# Patient Record
Sex: Male | Born: 1940 | Race: White | Hispanic: No | Marital: Married | Smoking: Never smoker
Health system: Southern US, Community
[De-identification: ages and names within clinical notes are randomized; demographics above are authoritative.]

## PROBLEM LIST (undated history)

## (undated) DIAGNOSIS — C61 Malignant neoplasm of prostate: Secondary | ICD-10-CM

## (undated) DIAGNOSIS — I499 Cardiac arrhythmia, unspecified: Secondary | ICD-10-CM

## (undated) DIAGNOSIS — I10 Essential (primary) hypertension: Secondary | ICD-10-CM

## (undated) DIAGNOSIS — I480 Paroxysmal atrial fibrillation: Secondary | ICD-10-CM

## (undated) HISTORY — PX: CHOLECYSTECTOMY: SHX55

## (undated) HISTORY — PX: BACK SURGERY: SHX140

## (undated) HISTORY — PX: HERNIA REPAIR: SHX51

## (undated) HISTORY — PX: PROSTATECTOMY: SHX69

## (undated) HISTORY — DX: Malignant neoplasm of prostate: C61

---

## 1999-03-20 ENCOUNTER — Emergency Department (HOSPITAL_COMMUNITY): Admission: EM | Admit: 1999-03-20 | Discharge: 1999-03-20 | Payer: Self-pay | Admitting: Emergency Medicine

## 1999-03-21 ENCOUNTER — Encounter: Payer: Self-pay | Admitting: Emergency Medicine

## 1999-04-04 ENCOUNTER — Ambulatory Visit (HOSPITAL_COMMUNITY): Admission: RE | Admit: 1999-04-04 | Discharge: 1999-04-04 | Payer: Self-pay | Admitting: *Deleted

## 1999-04-16 ENCOUNTER — Ambulatory Visit (HOSPITAL_COMMUNITY): Admission: RE | Admit: 1999-04-16 | Discharge: 1999-04-17 | Payer: Self-pay | Admitting: General Surgery

## 1999-04-24 ENCOUNTER — Encounter: Payer: Self-pay | Admitting: Emergency Medicine

## 1999-04-24 ENCOUNTER — Emergency Department (HOSPITAL_COMMUNITY): Admission: EM | Admit: 1999-04-24 | Discharge: 1999-04-24 | Payer: Self-pay | Admitting: Emergency Medicine

## 1999-04-30 ENCOUNTER — Ambulatory Visit (HOSPITAL_BASED_OUTPATIENT_CLINIC_OR_DEPARTMENT_OTHER): Admission: RE | Admit: 1999-04-30 | Discharge: 1999-04-30 | Payer: Self-pay | Admitting: General Surgery

## 2002-01-21 ENCOUNTER — Ambulatory Visit (HOSPITAL_COMMUNITY): Admission: RE | Admit: 2002-01-21 | Discharge: 2002-01-21 | Payer: Self-pay | Admitting: Gastroenterology

## 2005-10-05 ENCOUNTER — Ambulatory Visit (HOSPITAL_COMMUNITY): Admission: RE | Admit: 2005-10-05 | Discharge: 2005-10-05 | Payer: Self-pay | Admitting: Pediatrics

## 2005-10-13 ENCOUNTER — Ambulatory Visit: Payer: Self-pay | Admitting: Internal Medicine

## 2008-11-19 ENCOUNTER — Ambulatory Visit (HOSPITAL_COMMUNITY): Admission: RE | Admit: 2008-11-19 | Discharge: 2008-11-19 | Payer: Self-pay | Admitting: Urology

## 2009-04-28 ENCOUNTER — Ambulatory Visit: Admission: RE | Admit: 2009-04-28 | Discharge: 2009-07-03 | Payer: Self-pay | Admitting: Radiation Oncology

## 2009-05-04 LAB — PSA: PSA: 0.31 ng/mL (ref 0.10–4.00)

## 2009-07-05 ENCOUNTER — Ambulatory Visit: Admission: RE | Admit: 2009-07-05 | Discharge: 2009-07-17 | Payer: Self-pay | Admitting: Radiation Oncology

## 2010-03-17 ENCOUNTER — Ambulatory Visit: Payer: Self-pay | Admitting: Vascular Surgery

## 2010-03-17 ENCOUNTER — Ambulatory Visit: Admission: RE | Admit: 2010-03-17 | Discharge: 2010-03-17 | Payer: Self-pay | Admitting: Urology

## 2010-03-18 ENCOUNTER — Ambulatory Visit (HOSPITAL_COMMUNITY): Admission: RE | Admit: 2010-03-18 | Discharge: 2010-03-18 | Payer: Self-pay | Admitting: Urology

## 2010-05-23 ENCOUNTER — Emergency Department (HOSPITAL_COMMUNITY): Admission: EM | Admit: 2010-05-23 | Discharge: 2010-05-23 | Payer: Self-pay | Admitting: Emergency Medicine

## 2010-11-09 ENCOUNTER — Other Ambulatory Visit (HOSPITAL_COMMUNITY): Payer: Self-pay | Admitting: Urology

## 2010-11-09 ENCOUNTER — Other Ambulatory Visit: Payer: Self-pay

## 2010-11-09 DIAGNOSIS — C61 Malignant neoplasm of prostate: Secondary | ICD-10-CM

## 2010-11-17 ENCOUNTER — Encounter (HOSPITAL_COMMUNITY)
Admission: RE | Admit: 2010-11-17 | Discharge: 2010-11-17 | Disposition: A | Discharge status: Home / Self Care | Payer: Medicare PPO | Source: Ambulatory Visit | Attending: Urology | Admitting: Urology

## 2010-11-17 ENCOUNTER — Other Ambulatory Visit (HOSPITAL_COMMUNITY): Payer: Self-pay

## 2010-11-17 ENCOUNTER — Other Ambulatory Visit (HOSPITAL_COMMUNITY): Payer: Self-pay | Admitting: Urology

## 2010-11-17 ENCOUNTER — Encounter (HOSPITAL_COMMUNITY): Payer: Self-pay

## 2010-11-17 DIAGNOSIS — K7689 Other specified diseases of liver: Secondary | ICD-10-CM | POA: Insufficient documentation

## 2010-11-17 DIAGNOSIS — M899 Disorder of bone, unspecified: Secondary | ICD-10-CM | POA: Insufficient documentation

## 2010-11-17 DIAGNOSIS — C61 Malignant neoplasm of prostate: Secondary | ICD-10-CM

## 2010-11-17 DIAGNOSIS — Z9079 Acquired absence of other genital organ(s): Secondary | ICD-10-CM | POA: Insufficient documentation

## 2010-11-17 DIAGNOSIS — Z923 Personal history of irradiation: Secondary | ICD-10-CM | POA: Insufficient documentation

## 2010-11-17 HISTORY — DX: Essential (primary) hypertension: I10

## 2010-11-17 HISTORY — DX: Malignant neoplasm of prostate: C61

## 2010-11-17 LAB — POCT I-STAT, CHEM 8
Creatinine, Ser: 1.3 mg/dL (ref 0.4–1.5)
HCT: 52 % (ref 39.0–52.0)
Potassium: 3.6 mEq/L (ref 3.5–5.1)

## 2010-11-17 MED ORDER — IOHEXOL 300 MG/ML  SOLN
125.0000 mL | Freq: Once | INTRAMUSCULAR | Status: AC | PRN
  Administered 2010-11-17: 125 mL via INTRAVENOUS

## 2010-11-17 MED ORDER — TECHNETIUM TC 99M MEDRONATE IV KIT
21.0000 | PACK | Freq: Once | INTRAVENOUS | Status: AC | PRN
  Administered 2010-11-17: 21 via INTRAVENOUS

## 2010-11-17 MED ORDER — TECHNETIUM TC 99M MEDRONATE IV KIT: 25.0000 | PACK | Freq: Once | INTRAVENOUS | Status: DC | PRN

## 2010-11-19 NOTE — Procedures (Signed)
[facility]  Patient:    Gregory Bryan, Gregory Bryan Visit Number: [id number] MRN: [id number]          Service Type: END Location: ENDO Attending Physician:  Orland Mustard Dictated by:   Llana Aliment. Randa Evens, M.D. Proc. Date: 01/21/02 Admit Date:  01/21/2002 Discharge Date: 01/21/2002   CC:         Wilber Bihari, M.D.   Procedure Report  PROCEDURE:  Colonoscopy.  DATE OF BIRTH:  10-25-1940  MEDICATIONS:  Fentanyl 125 mcg, Versed 8 mg IV.  SCOPE:  Olympus pediatric video colonoscope.  INDICATIONS FOR PROCEDURE:  Strong family history of colon cancer.  DESCRIPTION OF PROCEDURE:  The procedure had been explained to the patient and consent obtained. With the patient in the left lateral decubitus position, the Olympus pediatric video colonoscope was inserted and advanced under direct visualization. The prep was excellent. We were able to reach the cecum using abdominal pressure and position changes. The ileocecal valve and appendiceal orifice were seen. The scope was withdrawn and the cecum, ascending colon, hepatic flexure, transverse colon, splenic flexure, descending and sigmoid colon were seen well upon removal. No polyps or other lesions were seen. Small internal hemorrhoids were seen in the rectum upon removal of the scope. The scope was withdrawn. The patient tolerated the procedure well.  ASSESSMENT:  Strong family history of colonic neoplasia with no polyps seen.  PLAN:  Will recommend repeating procedure in five years. Dictated by:   Llana Aliment. Randa Evens, M.D. Attending Physician:  Orland Mustard DD:  01/21/02 TD:  01/25/02 Job: [id number]

## 2010-12-09 ENCOUNTER — Other Ambulatory Visit: Payer: Self-pay | Admitting: Oncology

## 2010-12-09 ENCOUNTER — Encounter (HOSPITAL_BASED_OUTPATIENT_CLINIC_OR_DEPARTMENT_OTHER): Payer: Medicare PPO | Admitting: Oncology

## 2010-12-09 DIAGNOSIS — C7952 Secondary malignant neoplasm of bone marrow: Secondary | ICD-10-CM

## 2010-12-09 DIAGNOSIS — C61 Malignant neoplasm of prostate: Secondary | ICD-10-CM

## 2010-12-09 DIAGNOSIS — Z5111 Encounter for antineoplastic chemotherapy: Secondary | ICD-10-CM

## 2010-12-09 LAB — CBC WITH DIFFERENTIAL/PLATELET
BASO%: 0.4 % (ref 0.0–2.0)
Basophils Absolute: 0 10*3/uL (ref 0.0–0.1)
EOS%: 2.6 % (ref 0.0–7.0)
Eosinophils Absolute: 0.1 10*3/uL (ref 0.0–0.5)
LYMPH%: 25 % (ref 14.0–49.0)
MCH: 31 pg (ref 27.2–33.4)
MCHC: 34.6 g/dL (ref 32.0–36.0)
MCV: 89.6 fL (ref 79.3–98.0)
MONO#: 0.6 10*3/uL (ref 0.1–0.9)
NEUT#: 3.3 10*3/uL (ref 1.5–6.5)
NEUT%: 60.9 % (ref 39.0–75.0)
RDW: 13.7 % (ref 11.0–14.6)
lymph#: 1.4 10*3/uL (ref 0.9–3.3)

## 2010-12-09 LAB — COMPREHENSIVE METABOLIC PANEL
ALT: 20 U/L (ref 0–53)
AST: 19 U/L (ref 0–37)
Albumin: 4.2 g/dL (ref 3.5–5.2)
BUN: 18 mg/dL (ref 6–23)
Calcium: 9.1 mg/dL (ref 8.4–10.5)
Chloride: 102 mEq/L (ref 96–112)
Sodium: 143 mEq/L (ref 135–145)
Total Bilirubin: 0.5 mg/dL (ref 0.3–1.2)

## 2011-01-20 ENCOUNTER — Other Ambulatory Visit: Payer: Self-pay | Admitting: Oncology

## 2011-01-20 ENCOUNTER — Encounter (HOSPITAL_BASED_OUTPATIENT_CLINIC_OR_DEPARTMENT_OTHER): Payer: Medicare PPO | Admitting: Oncology

## 2011-01-20 DIAGNOSIS — E876 Hypokalemia: Secondary | ICD-10-CM

## 2011-01-20 DIAGNOSIS — C61 Malignant neoplasm of prostate: Secondary | ICD-10-CM

## 2011-01-20 DIAGNOSIS — C7952 Secondary malignant neoplasm of bone marrow: Secondary | ICD-10-CM

## 2011-01-20 LAB — CBC WITH DIFFERENTIAL/PLATELET
BASO%: 0.3 % (ref 0.0–2.0)
EOS%: 1.1 % (ref 0.0–7.0)
Eosinophils Absolute: 0.1 10*3/uL (ref 0.0–0.5)
HCT: 46.3 % (ref 38.4–49.9)
MCHC: 34 g/dL (ref 32.0–36.0)
NEUT#: 4.3 10*3/uL (ref 1.5–6.5)
RBC: 5.12 10*6/uL (ref 4.20–5.82)
WBC: 5.9 10*3/uL (ref 4.0–10.3)
lymph#: 1 10*3/uL (ref 0.9–3.3)

## 2011-01-20 LAB — COMPREHENSIVE METABOLIC PANEL
ALT: 45 U/L (ref 0–53)
Alkaline Phosphatase: 123 U/L — ABNORMAL HIGH (ref 39–117)
CO2: 30 mEq/L (ref 19–32)
Creatinine, Ser: 1.26 mg/dL (ref 0.50–1.35)
Potassium: 2.9 mEq/L — ABNORMAL LOW (ref 3.5–5.3)
Sodium: 140 mEq/L (ref 135–145)

## 2011-01-20 LAB — PSA: PSA: 0.32 ng/mL (ref <=4.00)

## 2011-03-08 ENCOUNTER — Encounter (HOSPITAL_BASED_OUTPATIENT_CLINIC_OR_DEPARTMENT_OTHER): Payer: Medicare PPO | Admitting: Oncology

## 2011-03-08 DIAGNOSIS — Z5111 Encounter for antineoplastic chemotherapy: Secondary | ICD-10-CM

## 2011-03-08 DIAGNOSIS — C61 Malignant neoplasm of prostate: Secondary | ICD-10-CM

## 2011-03-08 DIAGNOSIS — C7951 Secondary malignant neoplasm of bone: Secondary | ICD-10-CM

## 2011-05-19 ENCOUNTER — Encounter: Payer: Self-pay | Admitting: *Deleted

## 2011-06-06 ENCOUNTER — Telehealth: Payer: Self-pay | Admitting: Oncology

## 2011-06-07 ENCOUNTER — Ambulatory Visit: Payer: Medicare PPO | Admitting: Oncology

## 2011-06-22 ENCOUNTER — Encounter: Payer: Self-pay | Admitting: Oncology

## 2011-06-22 ENCOUNTER — Other Ambulatory Visit: Payer: Self-pay | Admitting: Oncology

## 2011-06-22 ENCOUNTER — Ambulatory Visit (HOSPITAL_BASED_OUTPATIENT_CLINIC_OR_DEPARTMENT_OTHER): Payer: Medicare PPO | Admitting: Oncology

## 2011-06-22 ENCOUNTER — Telehealth: Payer: Self-pay | Admitting: Oncology

## 2011-06-22 VITALS — BP 148/97 | HR 62 | Temp 97.4°F | Ht 71.8 in | Wt 244.6 lb

## 2011-06-22 DIAGNOSIS — Z5111 Encounter for antineoplastic chemotherapy: Secondary | ICD-10-CM

## 2011-06-22 DIAGNOSIS — C7952 Secondary malignant neoplasm of bone marrow: Secondary | ICD-10-CM

## 2011-06-22 DIAGNOSIS — C61 Malignant neoplasm of prostate: Secondary | ICD-10-CM

## 2011-06-22 HISTORY — DX: Malignant neoplasm of prostate: C61

## 2011-06-22 MED ORDER — LEUPROLIDE ACETATE (3 MONTH) 22.5 MG IM KIT
22.5000 mg | PACK | Freq: Once | INTRAMUSCULAR | Status: AC
  Administered 2011-06-22: 22.5 mg via INTRAMUSCULAR
  Filled 2011-06-22: qty 22.5

## 2011-06-22 MED ORDER — DENOSUMAB 120 MG/1.7ML ~~LOC~~ SOLN
120.0000 mg | Freq: Once | SUBCUTANEOUS | Status: AC
  Administered 2011-06-22: 120 mg via SUBCUTANEOUS
  Filled 2011-06-22: qty 1.7

## 2011-06-22 NOTE — Telephone Encounter (Signed)
gve the pt his jan,march 2013 appt calendars

## 2011-06-22 NOTE — Progress Notes (Signed)
Hematology and Oncology Follow Up Visit  Gregory Bryan [id number] Jun 04, 1941 70 y.o. 06/22/2011 4:12 PM  CC: Gregory Bryan, M.D.  Gregory Bryan, M.D.  Gregory Bryan, M.D.    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  Initial diagnosis of prostate cancer dating back to 2010, Gleason score 5 + 4 = 9, PSA 4.1.    Prior Therapy: 1. Status post prostatectomy, at that time was T3a N0 M0 disease, done December 24, 2008.  PSA nadir down to 0.25. 2. The patient received adjuvant radiation therapy to the prostatic bed due to a rise in his PSA and positive margins after his prostatectomy.  He received a total of 33 fractions for a total of 6600 cGy at that time.  Therapy between May 25, 2009, until July 15, 2009. 3. The patient had a rise in his PSA and subsequently his PSA on December 09, 2010, was as high as 10.0, and a bone scan showed bony metastasis.  Current therapy: 1. He is on Lupron 22.5 mg subcutaneous injection started on 12/09/2010.  He is status post 1 month of bicalutamide at 50 mg daily. 2. He is on vitamin D and calcium supplement.  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  He is a gentleman with hormone-sensitive prostate cancer with metastatic disease to the bone.  He had his first Lupron on 12/09/2010.  He had an excellent PSA response, with PSA dropping down to 0.32 on 01/20/2011.  His most recent PSA dropped further to 0.01.  He has tolerated Lupron without any major toxicity.  He does report some hot flashes again.  By his description, they are not quite as severe, associated with some perspiration that is easily manageable.  He had not reported any major changes in his activity level.  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  His energy has been reasonable.  He has not reported any back pain.  He did not report any shoulder pain.   Did not report any pathological fractures.  Performance status and activity level remain reasonable. He reports frequent urinations espieclly at night time. No bone pain noted at this time.  Medications: I have reviewed the patient's current medications. Current outpatient prescriptions:aspirin 325 MG tablet, Take 325 mg by mouth daily.  , Disp: , Rfl: ;  calcium carbonate (OS-CAL) 600 MG TABS, Take 600 mg by mouth daily.  , Disp: , Rfl: ;  diltiazem (TIAZAC) 300 MG 24 hr capsule, Take 300 mg by mouth daily.  , Disp: , Rfl: ;  hydrochlorothiazide (HYDRODIURIL) 25 MG tablet, Take 25 mg by mouth daily.  , Disp: , Rfl: ;  bicalutamide (CASODEX) 50 MG tablet, Take 50 mg by mouth daily.  , Disp: , Rfl:  Potassium Chloride (K-TABS PO), Take 20 mEq by mouth daily.  , Disp: , Rfl:  Current facility-administered medications:denosumab (XGEVA) injection 120 mg, 120 mg, Subcutaneous, Once, Eli Hose, MD, 120 mg at 06/22/11 1608;  leuprolide (LUPRON) injection 22.5 mg, 22.5 mg, Intramuscular, Once, Eli Hose, MD, 22.5 mg at 06/22/11 1611  Allergies:  Allergies  Allergen Reactions  . Oxycodone Nausea And Vomiting  . Percocet (Oxycodone-Acetaminophen)     Past Medical History, Surgical history, Social history, and Family History were reviewed and updated.  Review of Systems: Constitutional:  Negative for fever, chills, night sweats, anorexia, weight loss, pain. Cardiovascular: no chest pain or dyspnea  on exertion Respiratory: no cough, shortness of breath, or wheezing Neurological: no TIA or stroke symptoms negative Dermatological: negative ENT: negative Skin: Negative. Gastrointestinal: negative Genito-Urinary: positive for - urinary frequency/urgency Hematological and Lymphatic: negative Breast: negative Musculoskeletal: negative Remaining ROS negative. Physical Exam: Blood pressure 148/97, pulse 62, temperature 97.4 F (36.3 C), temperature source Oral, height 5' 11.8" (1.824 m), weight  244 lb 9.6 oz (110.95 kg). ECOG: 0 General appearance: alert Head: Normocephalic, without obvious abnormality, atraumatic Neck: no adenopathy, no carotid bruit, no JVD, supple, symmetrical, trachea midline and thyroid not enlarged, symmetric, no tenderness/mass/nodules Lymph nodes: Cervical, supraclavicular, and axillary nodes normal. Heart:regular rate and rhythm, S1, S2 normal, no murmur, click, rub or gallop Lung:chest clear, no wheezing, rales, normal symmetric air entry Abdomin: soft, non-tender, without masses or organomegaly EXT:no erythema, induration, or nodules   Lab Results:  CBC:  Normal CMET: Normal PSA: 0.01    Impression and Plan: A 70 year old gentleman with the following issues: 1. Hormone-sensitive prostate cancer.  He has metastatic disease to the bone.  His PSA dropped down from 10 down to 0.01.  He has continued to be asymptomatic at this point.  He has continued to be hormone sensitive.  I will proceed with Lupron today and will repeat it every 3 months.  I continued to reiterate some of the toxicities associated with Lupron and testosterone deprivation that include osteoporosis, hot flashes, erectile dysfunction as well as possible cognitive difficulties. 2. Bony disease.  We will proceed with Xgeva today. Risks and complications discussed today. He is agreeable. Will repeat in 6 weeks. 3. Hypokalemia.  His potassium is now back to normal at 3.8 and no longer needs supplements at this time.     [facility], MD 12/19/20124:12 PM

## 2011-08-03 ENCOUNTER — Ambulatory Visit (HOSPITAL_BASED_OUTPATIENT_CLINIC_OR_DEPARTMENT_OTHER): Payer: Medicare Other

## 2011-08-03 ENCOUNTER — Other Ambulatory Visit: Payer: Self-pay | Admitting: Oncology

## 2011-08-03 VITALS — BP 120/80 | HR 77 | Temp 99.1°F

## 2011-08-03 DIAGNOSIS — C7951 Secondary malignant neoplasm of bone: Secondary | ICD-10-CM

## 2011-08-03 DIAGNOSIS — C61 Malignant neoplasm of prostate: Secondary | ICD-10-CM

## 2011-08-03 MED ORDER — DENOSUMAB 120 MG/1.7ML ~~LOC~~ SOLN
120.0000 mg | Freq: Once | SUBCUTANEOUS | Status: AC
  Administered 2011-08-03: 120 mg via SUBCUTANEOUS
  Filled 2011-08-03: qty 1.7

## 2011-08-04 ENCOUNTER — Telehealth: Payer: Self-pay | Admitting: Oncology

## 2011-08-04 NOTE — Telephone Encounter (Signed)
Called pt re appt for 2/27 @ 1 pm (d/t per FS). Per pt he couldn't take info down right now. Per pt he will call our office back to get appt info.

## 2011-08-31 ENCOUNTER — Ambulatory Visit: Payer: Medicare Other | Admitting: Oncology

## 2011-09-16 ENCOUNTER — Ambulatory Visit (HOSPITAL_BASED_OUTPATIENT_CLINIC_OR_DEPARTMENT_OTHER): Payer: Medicare Other | Admitting: Oncology

## 2011-09-16 ENCOUNTER — Telehealth: Payer: Self-pay | Admitting: Oncology

## 2011-09-16 VITALS — BP 115/67 | HR 79 | Temp 97.7°F | Ht 71.8 in | Wt 229.0 lb

## 2011-09-16 DIAGNOSIS — C7951 Secondary malignant neoplasm of bone: Secondary | ICD-10-CM

## 2011-09-16 DIAGNOSIS — C61 Malignant neoplasm of prostate: Secondary | ICD-10-CM

## 2011-09-16 MED ORDER — LEUPROLIDE ACETATE (3 MONTH) 22.5 MG IM KIT
22.5000 mg | PACK | Freq: Once | INTRAMUSCULAR | Status: AC
  Administered 2011-09-16: 22.5 mg via INTRAMUSCULAR
  Filled 2011-09-16: qty 22.5

## 2011-09-16 MED ORDER — DENOSUMAB 120 MG/1.7ML ~~LOC~~ SOLN
120.0000 mg | Freq: Once | SUBCUTANEOUS | Status: AC
  Administered 2011-09-16: 120 mg via SUBCUTANEOUS
  Filled 2011-09-16: qty 1.7

## 2011-09-16 NOTE — Telephone Encounter (Signed)
appts made and printed for pt aom °

## 2011-09-16 NOTE — Progress Notes (Signed)
Hematology and Oncology Follow Up Visit  Gregory Bryan [id number] 01-26-1941 71 y.o. 09/16/2011 1:51 PM  CC: [name], M.D.  [name], M.D.  [name], M.D.    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  Initial diagnosis of prostate cancer dating back to 2010, Gleason score 5 + 4 = 9, PSA 4.1.    Prior Therapy: 1. Status post prostatectomy, at that time was T3a N0 M0 disease, done December 24, 2008.  PSA nadir down to 0.25. 2. The patient received adjuvant radiation therapy to the prostatic bed due to a rise in his PSA and positive margins after his prostatectomy.  He received a total of 33 fractions for a total of 6600 cGy at that time.  Therapy between May 25, 2009, until July 15, 2009. 3. The patient had a rise in his PSA and subsequently his PSA on December 09, 2010, was as high as 10.0, and a bone scan showed bony metastasis.  Current therapy: 1. He is on Lupron 22.5 mg subcutaneous injection started on 12/09/2010.  He is status post 1 month of bicalutamide at 50 mg daily. 2. He is on vitamin D and calcium supplement.  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  He is a gentleman with hormone-sensitive prostate cancer with metastatic disease to the bone.  He had his first Lupron on 12/09/2010.  He had an excellent PSA response, with PSA dropping down to 0.32 on 01/20/2011 and now it is 0.01.  He has tolerated Lupron without any major toxicity.  He does report some hot flashes again.  By his description, they are not quite as severe, associated with some perspiration that is easily manageable.  He had not reported any major changes in his activity level.  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  His energy has been reasonable.  He has not reported any back pain.  He did not report any shoulder pain.  Did not report any  pathological fractures.  Performance status and activity level remain reasonable. He reports frequent urinations espieclly at night time. No bone pain noted at this time. Energy has been the same at this time.  Medications: I have reviewed the patient's current medications. Current outpatient prescriptions:aspirin 325 MG tablet, Take 325 mg by mouth daily.  , Disp: , Rfl: ;  bicalutamide (CASODEX) 50 MG tablet, Take 50 mg by mouth daily.  , Disp: , Rfl: ;  calcium carbonate (OS-CAL) 600 MG TABS, Take 600 mg by mouth daily.  , Disp: , Rfl: ;  hydrochlorothiazide (HYDRODIURIL) 25 MG tablet, Take 25 mg by mouth daily.  , Disp: , Rfl: ;  Potassium Chloride (K-TABS PO), Take 20 mEq by mouth daily.  , Disp: , Rfl:  verapamil (CALAN) 120 MG tablet, Take 120 mg by mouth daily., Disp: , Rfl:  Current facility-administered medications:denosumab (XGEVA) injection 120 mg, 120 mg, Subcutaneous, Once, Benjiman Core, MD;  leuprolide (LUPRON) injection 22.5 mg, 22.5 mg, Intramuscular, Once, Benjiman Core, MD  Allergies:  Allergies  Allergen Reactions  . Oxycodone Nausea And Vomiting  . Percocet (Oxycodone-Acetaminophen)     Past Medical History, Surgical history, Social history, and Family History were reviewed and updated.  Review of Systems: Constitutional:  Negative for fever, chills, night sweats, anorexia, weight loss, pain. Cardiovascular: no chest pain or dyspnea on exertion Respiratory: no cough, shortness of breath,  or wheezing Neurological: no TIA or stroke symptoms negative Dermatological: negative ENT: negative Skin: Negative. Gastrointestinal: negative Genito-Urinary: positive for - urinary frequency/urgency Hematological and Lymphatic: negative Breast: negative Musculoskeletal: negative Remaining ROS negative. Physical Exam: Blood pressure 115/67, pulse 79, temperature 97.7 F (36.5 C), temperature source Oral, height 5' 11.8" (1.824 m), weight 229 lb (103.874 kg). ECOG: 0 General  appearance: alert Head: Normocephalic, without obvious abnormality, atraumatic Neck: no adenopathy, no carotid bruit, no JVD, supple, symmetrical, trachea midline and thyroid not enlarged, symmetric, no tenderness/mass/nodules Lymph nodes: Cervical, supraclavicular, and axillary nodes normal. Heart:regular rate and rhythm, S1, S2 normal, no murmur, click, rub or gallop Lung:chest clear, no wheezing, rales, normal symmetric air entry Abdomin: soft, non-tender, without masses or organomegaly EXT:no erythema, induration, or nodules   Lab Results reviewed.: 1/31  CBC:  Normal CMET: Normal PSA: 0.01    Impression and Plan: A 71 year old gentleman with the following issues: 1. Hormone-sensitive prostate cancer.  He has metastatic disease to the bone.  His PSA dropped down from 10 down to 0.01.  He has continued to be asymptomatic at this point.  He has continued to be hormone sensitive.  I will proceed with Lupron today and will repeat it every 3 months.  I continued to reiterate some of the toxicities associated with Lupron and testosterone deprivation that include osteoporosis, hot flashes, erectile dysfunction as well as possible cognitive difficulties. 2. Bony disease.  We will proceed with Xgeva today. Risks and complications discussed today. He is agreeable. Will repeat in 6 weeks. 3. Hypokalemia.  His potassium is now back to normal at 3.8 and no longer needs supplements at this time.     Eli Hose, MD 3/15/20131:51 PM

## 2011-11-04 ENCOUNTER — Ambulatory Visit: Payer: Medicare Other

## 2011-11-25 ENCOUNTER — Ambulatory Visit: Payer: Medicare Other

## 2011-11-30 ENCOUNTER — Telehealth: Payer: Self-pay | Admitting: *Deleted

## 2011-11-30 NOTE — Telephone Encounter (Signed)
patient called on 11-30-2011 to set up appointment for his missed xgeva injection patient confirmed over the phone on 11-30-2011

## 2011-12-01 ENCOUNTER — Ambulatory Visit (HOSPITAL_BASED_OUTPATIENT_CLINIC_OR_DEPARTMENT_OTHER): Payer: Medicare Other

## 2011-12-01 VITALS — BP 131/84 | HR 73 | Temp 97.4°F

## 2011-12-01 DIAGNOSIS — C7951 Secondary malignant neoplasm of bone: Secondary | ICD-10-CM

## 2011-12-01 DIAGNOSIS — C61 Malignant neoplasm of prostate: Secondary | ICD-10-CM

## 2011-12-01 MED ORDER — DENOSUMAB 120 MG/1.7ML ~~LOC~~ SOLN
120.0000 mg | Freq: Once | SUBCUTANEOUS | Status: AC
  Administered 2011-12-01: 120 mg via SUBCUTANEOUS
  Filled 2011-12-01: qty 1.7

## 2011-12-23 ENCOUNTER — Ambulatory Visit (HOSPITAL_BASED_OUTPATIENT_CLINIC_OR_DEPARTMENT_OTHER): Payer: Medicare Other | Admitting: Oncology

## 2011-12-23 ENCOUNTER — Telehealth: Payer: Self-pay | Admitting: Oncology

## 2011-12-23 VITALS — BP 124/82 | HR 84 | Temp 97.0°F | Wt 235.1 lb

## 2011-12-23 DIAGNOSIS — C7951 Secondary malignant neoplasm of bone: Secondary | ICD-10-CM

## 2011-12-23 DIAGNOSIS — C61 Malignant neoplasm of prostate: Secondary | ICD-10-CM

## 2011-12-23 MED ORDER — DENOSUMAB 120 MG/1.7ML ~~LOC~~ SOLN: 120.0000 mg | Freq: Once | SUBCUTANEOUS | Status: DC

## 2011-12-23 MED ORDER — LEUPROLIDE ACETATE (3 MONTH) 22.5 MG IM KIT
22.5000 mg | PACK | Freq: Once | INTRAMUSCULAR | Status: AC
  Administered 2011-12-23: 22.5 mg via INTRAMUSCULAR
  Filled 2011-12-23: qty 22.5

## 2011-12-23 NOTE — Telephone Encounter (Signed)
Gave pt appt for injection in August  And MD in September 2013

## 2011-12-23 NOTE — Progress Notes (Signed)
Hematology and Oncology Follow Up Visit  Gregory Bryan [id number] 14-Mar-1941 71 y.o. 12/23/2011 1:50 PM  CC: [name], M.D.  [name], M.D.  [name], M.D.    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  Initial diagnosis of prostate cancer dating back to 2010, Gleason score 5 + 4 = 9, PSA 4.1.    Prior Therapy: 1. Status post prostatectomy, at that time was T3a N0 M0 disease, done December 24, 2008.  PSA nadir down to 0.25. 2. The patient received adjuvant radiation therapy to the prostatic bed due to a rise in his PSA and positive margins after his prostatectomy.  He received a total of 33 fractions for a total of 6600 cGy at that time.  Therapy between May 25, 2009, until July 15, 2009. 3. The patient had a rise in his PSA and subsequently his PSA on December 09, 2010, was as high as 10.0, and a bone scan showed bony metastasis.  Current therapy: 1. He is on Lupron 22.5 mg subcutaneous injection started on 12/09/2010.  He is status post 1 month of bicalutamide at 50 mg daily. 2. He is on vitamin D and calcium supplement. 3. Xgeva every 4-6 months.   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  He is a gentleman with hormone-sensitive prostate cancer with metastatic disease to the bone.  He had his first Lupron on 12/09/2010.  He had an excellent PSA response, with PSA dropping down to 0.32 on 01/20/2011 and now it is 0.01.  He has tolerated Lupron without any major toxicity.  He does report some hot flashes again which occur daily and are manageable.  He had not reported any major changes in his activity level.  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  His energy has been reasonable.  He has not reported any back pain.  He did not report any shoulder pain.  Did not report any pathological fractures.  Performance status and  activity level remain reasonable. He reports frequent urinations espieclly at night time. No bone pain noted at this time.   Medications: I have reviewed the patient's current medications. Current outpatient prescriptions:aspirin 325 MG tablet, Take 325 mg by mouth daily.  , Disp: , Rfl: ;  bicalutamide (CASODEX) 50 MG tablet, Take 50 mg by mouth daily.  , Disp: , Rfl: ;  calcium carbonate (OS-CAL) 600 MG TABS, Take 600 mg by mouth daily.  , Disp: , Rfl: ;  hydrochlorothiazide (HYDRODIURIL) 25 MG tablet, Take 25 mg by mouth daily.  , Disp: , Rfl: ;  Potassium Chloride (K-TABS PO), Take 20 mEq by mouth daily.  , Disp: , Rfl:  verapamil (CALAN) 120 MG tablet, Take 120 mg by mouth daily., Disp: , Rfl: ;  DISCONTD: diltiazem (TIAZAC) 300 MG 24 hr capsule, Take 300 mg by mouth daily.  , Disp: , Rfl:  Current facility-administered medications:leuprolide (LUPRON) injection 22.5 mg, 22.5 mg, Intramuscular, Once, Benjiman Core, MD;  DISCONTD: denosumab (XGEVA) injection 120 mg, 120 mg, Subcutaneous, Once, Benjiman Core, MD  Allergies:  Allergies  Allergen Reactions  . Oxycodone Nausea And Vomiting  . Percocet (Oxycodone-Acetaminophen)     Past Medical History, Surgical history, Social history, and Family History were reviewed and updated.  Review of Systems: Constitutional:  Negative for fever, chills, night sweats, anorexia, weight loss, pain. Cardiovascular: no chest pain or  dyspnea on exertion Respiratory: no cough, shortness of breath, or wheezing Neurological: no TIA or stroke symptoms negative Dermatological: negative ENT: negative Skin: Negative. Gastrointestinal: negative Genito-Urinary: positive for - urinary frequency/urgency Hematological and Lymphatic: negative Breast: negative Musculoskeletal: negative Remaining ROS negative. Physical Exam: Blood pressure 124/82, pulse 84, temperature 97 F (36.1 C), temperature source Oral, weight 235 lb 1 oz (106.624 kg). ECOG: 0 General  appearance: alert Head: Normocephalic, without obvious abnormality, atraumatic Neck: no adenopathy, no carotid bruit, no JVD, supple, symmetrical, trachea midline and thyroid not enlarged, symmetric, no tenderness/mass/nodules Lymph nodes: Cervical, supraclavicular, and axillary nodes normal. Heart:regular rate and rhythm, S1, S2 normal, no murmur, click, rub or gallop Lung:chest clear, no wheezing, rales, normal symmetric air entry Abdomin: soft, non-tender, without masses or organomegaly EXT:no erythema, induration, or nodules   Lab Results reviewed 09/2011  CBC:  Normal CMET: Normal PSA: 0.01  Labs from 6/20 Pending: By patient report PSA is < 0.01   Impression and Plan: A 71 year old gentleman with the following issues: 1. Hormone-sensitive prostate cancer.  He has metastatic disease to the bone.  His PSA dropped down from 10 down to 0.01.  He has continued to be asymptomatic at this point.  He has continued to be hormone sensitive.  I will proceed with Lupron today and will repeat it every 3 months.  I continued to reiterate some of the toxicities associated with Lupron and testosterone deprivation that include osteoporosis, hot flashes, erectile dysfunction as well as possible cognitive difficulties. 2. Bony disease.  We will proceed with Xgeva today. Risks and complications discussed today. He is agreeable. Will repeat in 6 weeks. 3. Hypokalemia.  His potassium is now back to normal at 3.8 and no longer needs supplements at this time. Lytes from 6/20 are pending today.      Eli Hose, MD 6/21/20131:50 PM

## 2012-02-03 ENCOUNTER — Ambulatory Visit (HOSPITAL_BASED_OUTPATIENT_CLINIC_OR_DEPARTMENT_OTHER): Payer: Medicare Other

## 2012-02-03 VITALS — BP 138/95 | HR 69 | Temp 97.3°F

## 2012-02-03 DIAGNOSIS — C61 Malignant neoplasm of prostate: Secondary | ICD-10-CM

## 2012-02-03 DIAGNOSIS — C7952 Secondary malignant neoplasm of bone marrow: Secondary | ICD-10-CM

## 2012-02-03 MED ORDER — DENOSUMAB 120 MG/1.7ML ~~LOC~~ SOLN
120.0000 mg | Freq: Once | SUBCUTANEOUS | Status: AC
  Administered 2012-02-03: 120 mg via SUBCUTANEOUS
  Filled 2012-02-03: qty 1.7

## 2012-02-03 NOTE — Patient Instructions (Signed)
Call MD for pproblems 

## 2012-03-23 ENCOUNTER — Telehealth: Payer: Self-pay | Admitting: Oncology

## 2012-03-23 ENCOUNTER — Ambulatory Visit (HOSPITAL_BASED_OUTPATIENT_CLINIC_OR_DEPARTMENT_OTHER): Payer: Medicare Other | Admitting: Oncology

## 2012-03-23 VITALS — BP 132/82 | HR 77 | Temp 97.3°F | Resp 20 | Ht 71.8 in | Wt 236.2 lb

## 2012-03-23 DIAGNOSIS — C61 Malignant neoplasm of prostate: Secondary | ICD-10-CM

## 2012-03-23 DIAGNOSIS — C7951 Secondary malignant neoplasm of bone: Secondary | ICD-10-CM

## 2012-03-23 DIAGNOSIS — Z5111 Encounter for antineoplastic chemotherapy: Secondary | ICD-10-CM

## 2012-03-23 MED ORDER — LEUPROLIDE ACETATE (3 MONTH) 22.5 MG IM KIT
22.5000 mg | PACK | Freq: Once | INTRAMUSCULAR | Status: AC
  Administered 2012-03-23: 22.5 mg via INTRAMUSCULAR
  Filled 2012-03-23: qty 22.5

## 2012-03-23 MED ORDER — DENOSUMAB 120 MG/1.7ML ~~LOC~~ SOLN
120.0000 mg | Freq: Once | SUBCUTANEOUS | Status: AC
  Administered 2012-03-23: 120 mg via SUBCUTANEOUS
  Filled 2012-03-23: qty 1.7

## 2012-03-23 NOTE — Progress Notes (Signed)
Hematology and Oncology Follow Up Visit  JAISON SCHELLE [id number] 1940-10-14 71 y.o. 03/23/2012 1:39 PM  CC: [name], M.D.  [name], M.D.  [name], M.D.    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  Initial diagnosis of prostate cancer dating back to 2010, Gleason score 5 + 4 = 9, PSA 4.1.    Prior Therapy: 1. Status post prostatectomy, at that time was T3a N0 M0 disease, done December 24, 2008.  PSA nadir down to 0.25. 2. The patient received adjuvant radiation therapy to the prostatic bed due to a rise in his PSA and positive margins after his prostatectomy.  He received a total of 33 fractions for a total of 6600 cGy at that time.  Therapy between May 25, 2009, until July 15, 2009. 3. The patient had a rise in his PSA and subsequently his PSA on December 09, 2010, was as high as 10.0, and a bone scan showed bony metastasis.  Current therapy: 1. He is on Lupron 22.5 mg subcutaneous injection started on 12/09/2010.  He is status post 1 month of bicalutamide at 50 mg daily. 2. He is on vitamin D and calcium supplement. 3. 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  He is a gentleman with hormone-sensitive prostate cancer with metastatic disease to the bone.  He had his first Lupron on 12/09/2010.  He had an excellent PSA response, with PSA dropping down to 0.32 on 01/20/2011 and now it is 0.01.  He has tolerated Lupron without any major toxicity.  He does report some hot flashes again which occur daily and are manageable.  He had not reported any major changes in his activity level.  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  His energy has been reasonable.  He has not reported any back pain.  He did not report any shoulder pain.  Did not report any pathological fractures.  Performance status and activity  level remain reasonable. He reports frequent urinations espieclly at night time. No bone pain noted at this time. Hot flashes remains noticeable but manageable at this time.   Medications: I have reviewed the patient's current medications. Current outpatient prescriptions:aspirin 325 MG tablet, Take 325 mg by mouth daily.  , Disp: , Rfl: ;  calcium carbonate (OS-CAL) 600 MG TABS, Take 600 mg by mouth daily.  , Disp: , Rfl: ;  hydrochlorothiazide (HYDRODIURIL) 25 MG tablet, Take 25 mg by mouth daily.  , Disp: , Rfl: ;  verapamil (CALAN) 120 MG tablet, Take 120 mg by mouth daily., Disp: , Rfl:  DISCONTD: diltiazem (TIAZAC) 300 MG 24 hr capsule, Take 300 mg by mouth daily.  , Disp: , Rfl:  Current facility-administered medications:denosumab (XGEVA) injection 120 mg, 120 mg, Subcutaneous, Once, Benjiman Core, MD;  leuprolide (LUPRON) injection 22.5 mg, 22.5 mg, Intramuscular, Once, Benjiman Core, MD  Allergies:  Allergies  Allergen Reactions  . Oxycodone Nausea And Vomiting  . Percocet (Oxycodone-Acetaminophen)     Past Medical History, Surgical history, Social history, and Family History were reviewed and updated.  Review of Systems: Constitutional:  Negative for fever, chills, night sweats, anorexia, weight loss, pain. Cardiovascular: no chest pain or dyspnea on exertion Respiratory: no cough, shortness of breath, or wheezing Neurological: no TIA or stroke symptoms negative Dermatological: negative ENT: negative Skin: Negative. Gastrointestinal: negative Genito-Urinary: positive  for - urinary frequency/urgency Hematological and Lymphatic: negative Breast: negative Musculoskeletal: negative Remaining ROS negative. Physical Exam: Blood pressure 132/82, pulse 77, temperature 97.3 F (36.3 C), temperature source Oral, resp. rate 20, height 5' 11.8" (1.824 m), weight 236 lb 3.2 oz (107.14 kg). ECOG: 0 General appearance: alert Head: Normocephalic, without obvious abnormality,  atraumatic Neck: no adenopathy, no carotid bruit, no JVD, supple, symmetrical, trachea midline and thyroid not enlarged, symmetric, no tenderness/mass/nodules Lymph nodes: Cervical, supraclavicular, and axillary nodes normal. Heart:regular rate and rhythm, S1, S2 normal, no murmur, click, rub or gallop Lung:chest clear, no wheezing, rales, normal symmetric air entry Abdomin: soft, non-tender, without masses or organomegaly EXT:no erythema, induration, or nodules   Lab Results reviewed from 03/21/2012 (a copy to be scanned)  CBC:  Normal CMET: Normal Ca: 9.7 PSA: 0.01     Impression and Plan: A 71 year old gentleman with the following issues: 1. Hormone-sensitive prostate cancer.  He has metastatic disease to the bone.  His PSA dropped down from 10 down to 0.01.  He has continued to be asymptomatic at this point.  He has continued to be hormone sensitive.  I will proceed with Lupron today and will repeat it every 3 months.  I continued to reiterate some of the toxicities associated with Lupron and testosterone deprivation that include osteoporosis, hot flashes, erectile dysfunction as well as possible cognitive difficulties. 2. Bony disease.  We will proceed with Xgeva today. Risks and complications discussed today. He is agreeable. Will repeat in 6 weeks. 3. Hypokalemia.  His potassium is now back to normal at 4.1 and no longer needs supplements at this time. Lytes from 6/20 are pending today.      Eli Hose, MD 9/20/20131:39 PM

## 2012-03-23 NOTE — Telephone Encounter (Signed)
gve the pt his oct and dec 2013 appt calendar

## 2012-04-27 ENCOUNTER — Ambulatory Visit (HOSPITAL_BASED_OUTPATIENT_CLINIC_OR_DEPARTMENT_OTHER): Payer: Medicare Other

## 2012-04-27 VITALS — BP 141/95 | HR 75 | Temp 96.9°F

## 2012-04-27 DIAGNOSIS — C61 Malignant neoplasm of prostate: Secondary | ICD-10-CM

## 2012-04-27 DIAGNOSIS — C7951 Secondary malignant neoplasm of bone: Secondary | ICD-10-CM

## 2012-04-27 MED ORDER — DENOSUMAB 120 MG/1.7ML ~~LOC~~ SOLN
120.0000 mg | Freq: Once | SUBCUTANEOUS | Status: AC
  Administered 2012-04-27: 120 mg via SUBCUTANEOUS
  Filled 2012-04-27: qty 1.7

## 2012-06-22 ENCOUNTER — Ambulatory Visit (HOSPITAL_BASED_OUTPATIENT_CLINIC_OR_DEPARTMENT_OTHER): Payer: Medicare Other | Admitting: Oncology

## 2012-06-22 ENCOUNTER — Telehealth: Payer: Self-pay | Admitting: Oncology

## 2012-06-22 VITALS — BP 135/90 | HR 102 | Temp 97.2°F | Resp 18 | Ht 71.8 in | Wt 243.3 lb

## 2012-06-22 DIAGNOSIS — C7952 Secondary malignant neoplasm of bone marrow: Secondary | ICD-10-CM

## 2012-06-22 DIAGNOSIS — C61 Malignant neoplasm of prostate: Secondary | ICD-10-CM

## 2012-06-22 DIAGNOSIS — Z5111 Encounter for antineoplastic chemotherapy: Secondary | ICD-10-CM

## 2012-06-22 MED ORDER — DENOSUMAB 120 MG/1.7ML ~~LOC~~ SOLN: 120.0000 mg | Freq: Once | SUBCUTANEOUS | Status: DC

## 2012-06-22 MED ORDER — LEUPROLIDE ACETATE (3 MONTH) 22.5 MG IM KIT
22.5000 mg | PACK | Freq: Once | INTRAMUSCULAR | Status: AC
  Administered 2012-06-22: 22.5 mg via INTRAMUSCULAR
  Filled 2012-06-22: qty 22.5

## 2012-06-22 NOTE — Progress Notes (Signed)
Hematology and Oncology Follow Up Visit  Gregory Bryan [id number] 1941-03-06 72 y.o. 06/22/2012 2:37 PM  CC: Gregory Bryan, M.D.  [name], M.D.  [name], M.D.    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  Initial diagnosis of prostate cancer dating back to 2010, Gleason score 5 + 4 = 9, PSA 4.1.    Prior Therapy: 1. Status post prostatectomy, at that time was T3a N0 M0 disease, done December 24, 2008.  PSA nadir down to 0.25. 2. The patient received adjuvant radiation therapy to the prostatic bed due to a rise in his PSA and positive margins after his prostatectomy.  He received a total of 33 fractions for a total of 6600 cGy at that time.  Therapy between May 25, 2009, until July 15, 2009. 3. The patient had a rise in his PSA and subsequently his PSA on December 09, 2010, was as high as 10.0, and a bone scan showed bony metastasis.  Current therapy: 1. He is on Lupron 22.5 mg subcutaneous injection started on 12/09/2010.  He is status post 1 month of bicalutamide at 50 mg daily. 2. He is on vitamin D and calcium supplement. 3. 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  He is a gentleman with hormone-sensitive prostate cancer with metastatic disease to the bone.  He had his first Lupron on 12/09/2010.  He had an excellent PSA response, with PSA dropping down to 0.32 on 01/20/2011 and now it is less than  0.01.  He has tolerated Lupron without any major toxicity.  He does report some hot flashes again which occur daily and are manageable.  He had not reported any major changes in his activity level.  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  His energy has been reasonable.  He has not reported any back pain.  He did not report any shoulder pain.  Did not report any pathological fractures.  Performance status  and activity level remain reasonable. He reports frequent urinations espieclly at night time. No bone pain noted at this time. He is planing to have dental work done in th near future.    Medications: I have reviewed the patient's current medications. Current outpatient prescriptions:aspirin 325 MG tablet, Take 325 mg by mouth daily.  , Disp: , Rfl: ;  calcium carbonate (OS-CAL) 600 MG TABS, Take 600 mg by mouth daily.  , Disp: , Rfl: ;  hydrochlorothiazide (HYDRODIURIL) 25 MG tablet, Take 25 mg by mouth daily.  , Disp: , Rfl: ;  verapamil (CALAN) 120 MG tablet, Take 120 mg by mouth daily., Disp: , Rfl:  [DISCONTINUED] diltiazem (TIAZAC) 300 MG 24 hr capsule, Take 300 mg by mouth daily.  , Disp: , Rfl:  Current facility-administered medications:leuprolide (LUPRON) injection 22.5 mg, 22.5 mg, Intramuscular, Once, Myrtis Ser, NP  Allergies:  Allergies  Allergen Reactions  . Oxycodone Nausea And Vomiting  . Percocet (Oxycodone-Acetaminophen)     Past Medical History, Surgical history, Social history, and Family History were reviewed and updated.  Review of Systems: Constitutional:  Negative for fever, chills, night sweats, anorexia, weight loss, pain. Cardiovascular: no chest pain or dyspnea on exertion Respiratory: no cough, shortness of breath, or wheezing Neurological: no TIA or stroke symptoms negative Dermatological: negative ENT: negative Skin: Negative. Gastrointestinal: negative Genito-Urinary: positive for - urinary frequency/urgency Hematological and Lymphatic:  negative Breast: negative Musculoskeletal: negative Remaining ROS negative. Physical Exam: Blood pressure 135/90, pulse 102, temperature 97.2 F (36.2 C), temperature source Oral, resp. rate 18, height 5' 11.8" (1.824 m), weight 243 lb 4.8 oz (110.36 kg). ECOG: 0 General appearance: alert Head: Normocephalic, without obvious abnormality, atraumatic Neck: no adenopathy, no carotid bruit, no JVD, supple,  symmetrical, trachea midline and thyroid not enlarged, symmetric, no tenderness/mass/nodules Lymph nodes: Cervical, supraclavicular, and axillary nodes normal. Heart:regular rate and rhythm, S1, S2 normal, no murmur, click, rub or gallop Lung:chest clear, no wheezing, rales, normal symmetric air entry Abdomin: soft, non-tender, without masses or organomegaly EXT:no erythema, induration, or nodules   Lab Results reviewed from 06/21/2012 (a copy to be scanned)  CBC:  Normal CMET: Normal Ca: 9.2 PSA: less than 0.01     Impression and Plan: A 71 year old gentleman with the following issues: 1. Hormone-sensitive prostate cancer.  He has metastatic disease to the bone.  His PSA dropped down from 10 down to less than 0.01.  He has continued to be asymptomatic at this point.  He has continued to be hormone sensitive.  I will proceed with Lupron today and will repeat it every 3 months.  I continued to reiterate some of the toxicities associated with Lupron and testosterone deprivation that include osteoporosis, hot flashes, erectile dysfunction as well as possible cognitive difficulties. He is also interested in surgical castration and might get that done soon.  2. Bony disease.  We will hold Xgeva today. Risks and complications discussed today. He is agreeable. Will repeat in 12 weeks due to possible dental work.  3. Hypokalemia.  His potassium is now back to normal at 3.9 and no longer needs supplements at this time.      Ladoris Lythgoe, MD 12/20/20132:37 PM

## 2012-06-22 NOTE — Telephone Encounter (Signed)
Gave pt appt for March 2014 MD only

## 2012-06-28 IMAGING — CT CT ABD-PELV W/ CM
2 of 6 series · 17 of 46 positions shown, 19 images · IV contrast (agent unspecified)
Comparison: None.

CT CHEST

CLINICAL DATA: Prostate carcinoma.  Previous prostatectomy and
radiation therapy.

CT CHEST, ABDOMEN AND PELVIS WITH CONTRAST
TECHNIQUE: Multidetector CT imaging of the chest, abdomen and
pelvis was performed following the standard protocol during bolus
administration of intravenous contrast.
Contrast: 125 ml 6mnipaque-BWW and oral contrast

[Series 2: cap with st · axial · 0.93mm/px · z∈[+1165,+1790]mm · 14 of 143 slices shown, 16 images]
[im 9/143  soft-tissue]
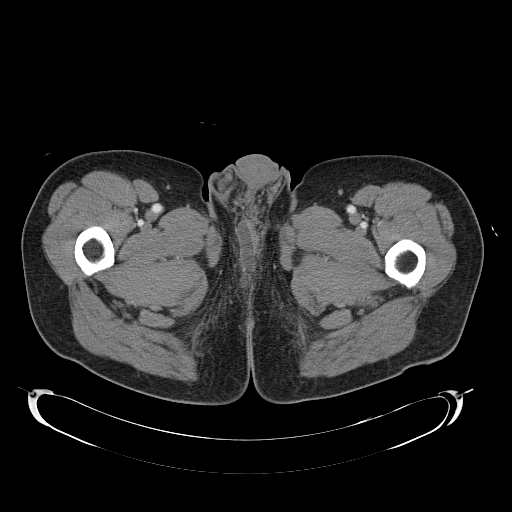
[im 9/143  bone]
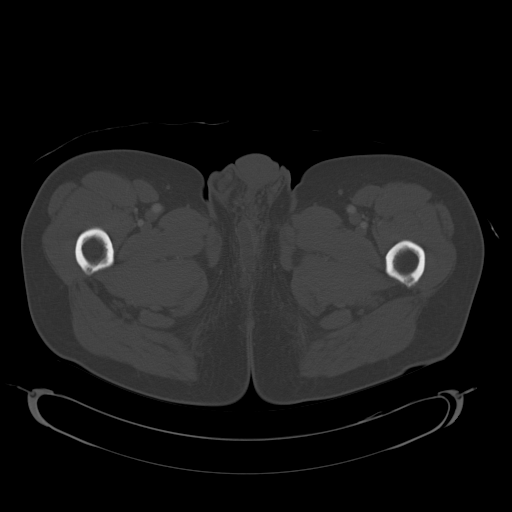
[im 17/143  soft-tissue]
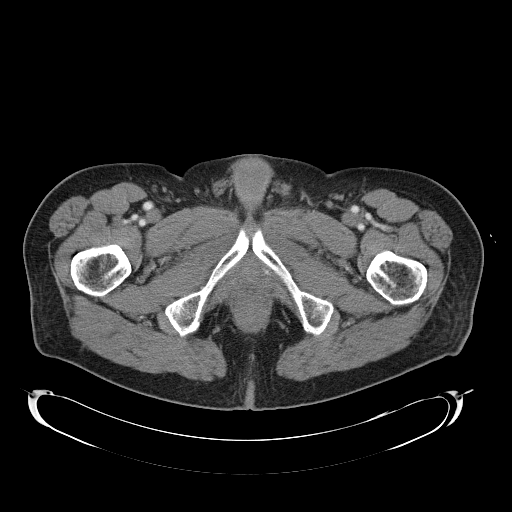
[im 26/143  soft-tissue]
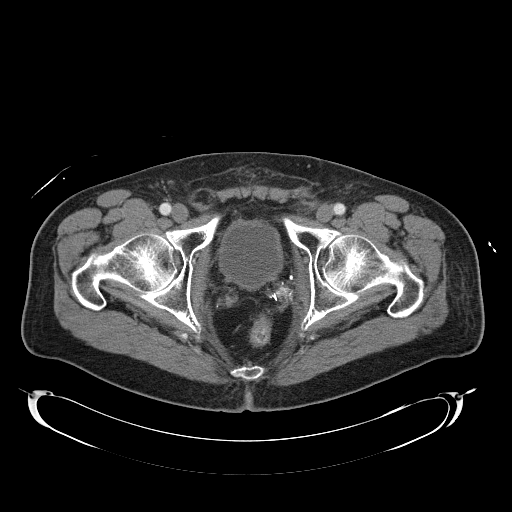
[im 42/143  soft-tissue]
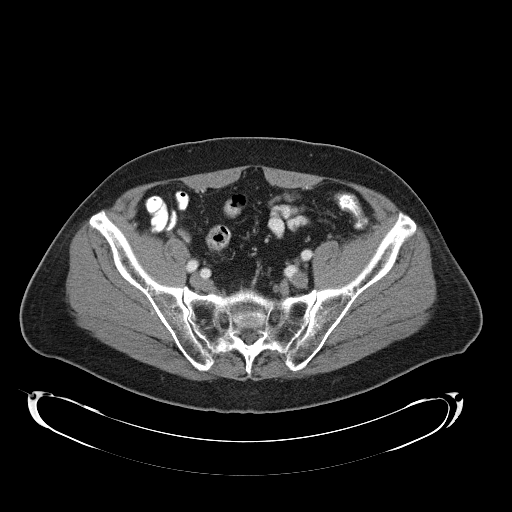
[im 51/143  soft-tissue]
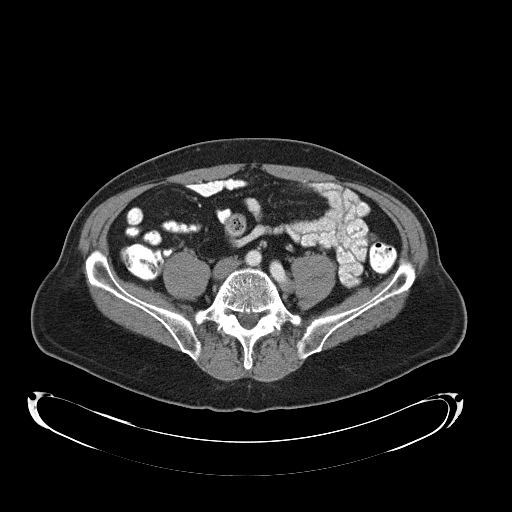
[im 59/143  soft-tissue]
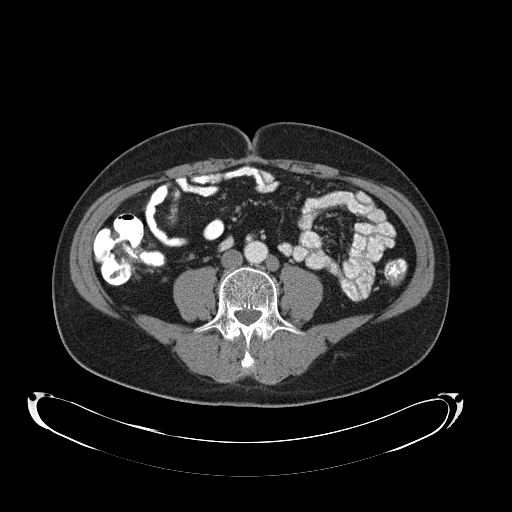
[im 67/143  soft-tissue]
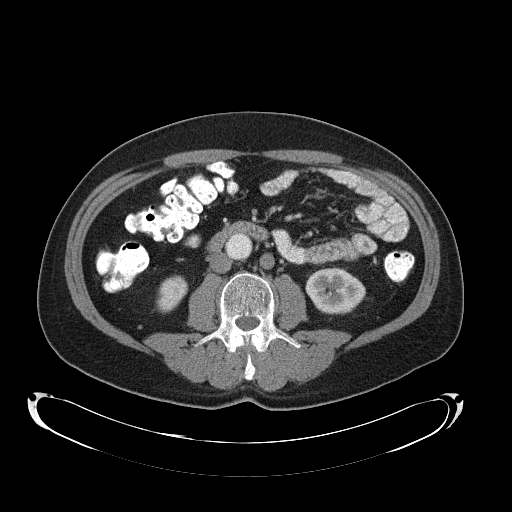
[im 76/143  soft-tissue]
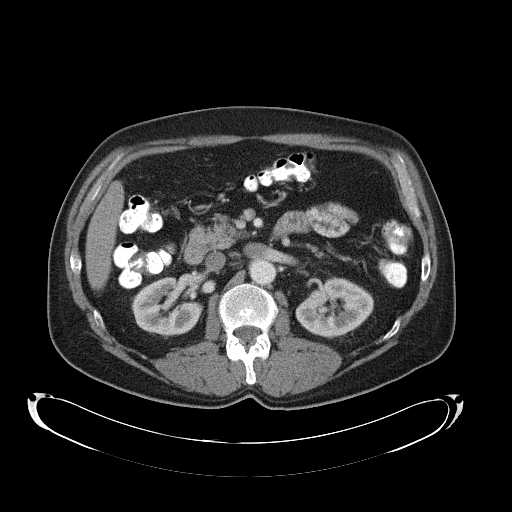
[im 84/143  soft-tissue]
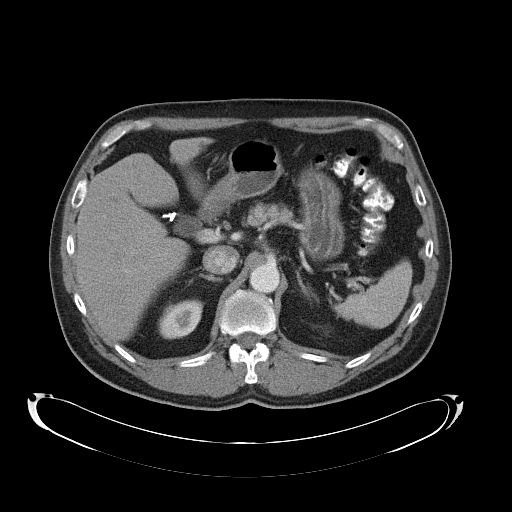
[im 84/143  bone]
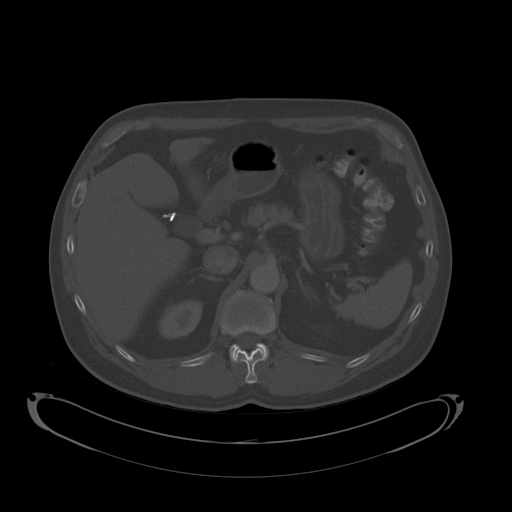
[im 92/143  soft-tissue]
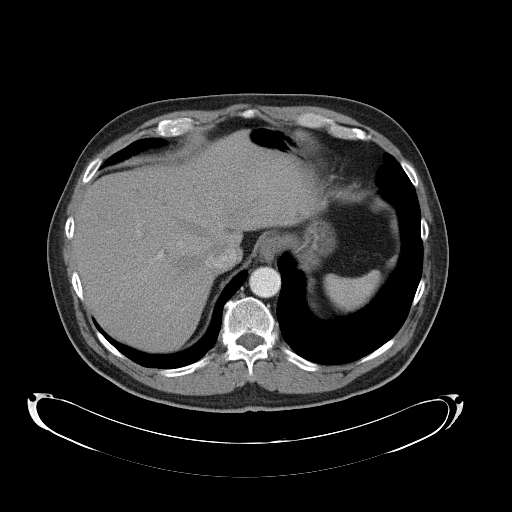
[im 109/143  soft-tissue]
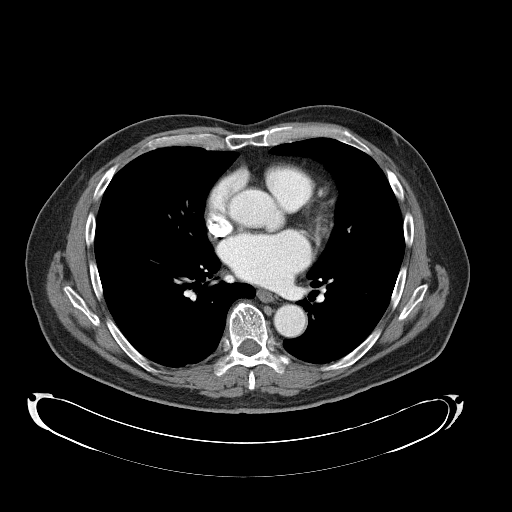
[im 117/143  soft-tissue]
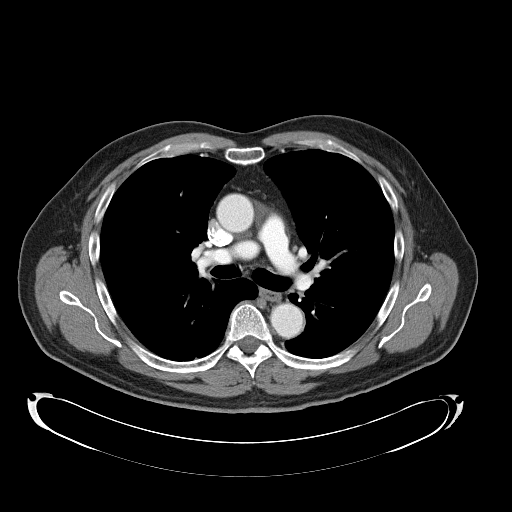
[im 126/143  soft-tissue]
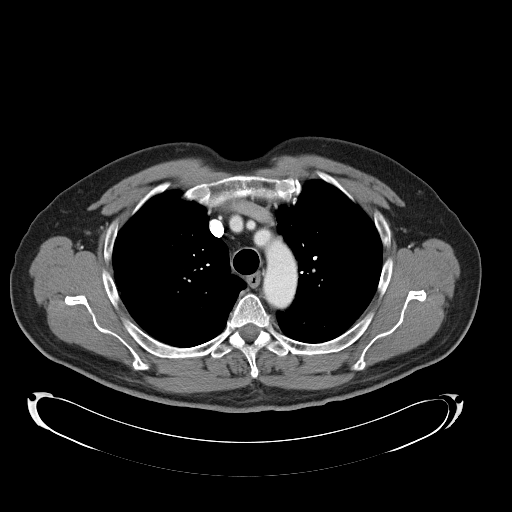
[im 134/143  soft-tissue]
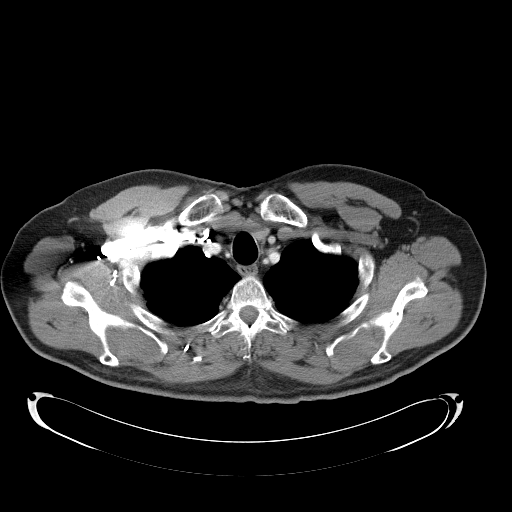

[Series 602: <mpr thick range> · coronal · 1.39mm/px · 3 of 98 slices shown]
[im 33/98  soft-tissue]
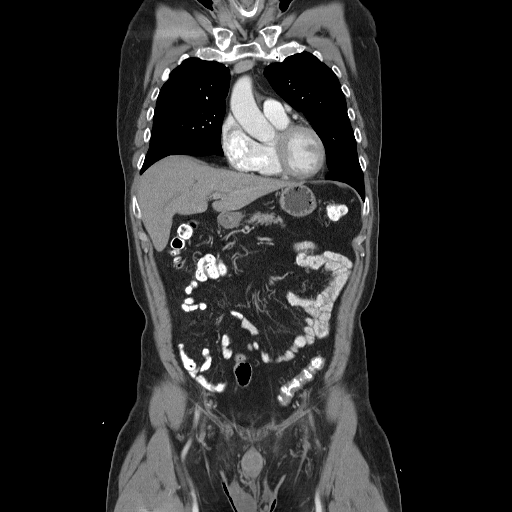
[im 44/98  soft-tissue]
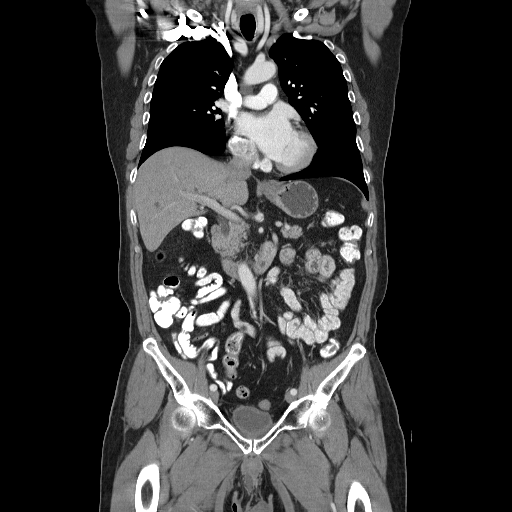
[im 54/98  soft-tissue]
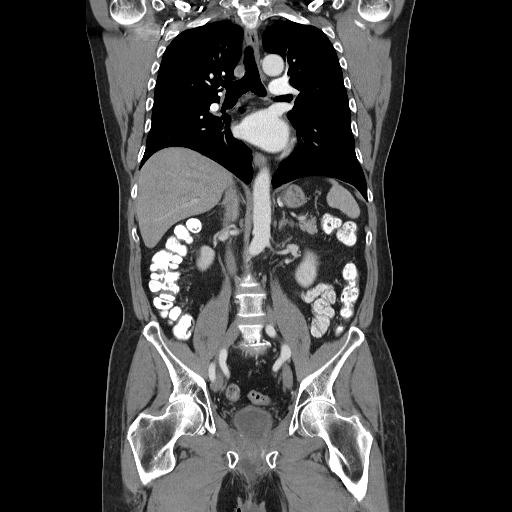

[17 of 46 positions shown; findings below may reference images not displayed]

FINDINGS: Both lungs are clear.  No suspicious pulmonary nodules
or masses are identified.  There is no evidence of pulmonary
infiltrate.  No evidence of pleural or pericardial effusion.

No evidence of hilar or mediastinal masses.  No evidence of
adenopathy elsewhere within the thorax.  No suspicious bone lesions
are identified.
IMPRESSION: Negative.  No evidence of metastatic disease or other significant
abnormality.

CT ABDOMEN AND PELVIS
FINDINGS: Several small hepatic cysts are seen, but no liver
masses are identified.  Surgical clips are seen from prior
cholecystectomy.  The pancreas, spleen, adrenal glands, and kidneys
are normal in appearance.  No soft tissue masses or lymphadenopathy
are identified within the abdomen or pelvis.  Duplicated IVC is
incidentally noted, however there is no evidence of retroperitoneal
lymphadenopathy.

Expected postop changes from prostatectomy noted.  No pelvic
lymphadenopathy other masses.  No evidence of inflammatory process
or abnormal fluid collections.  No evidence of bowel wall
thickening or dilatation.  No suspicious bone lesions are
identified.
IMPRESSION: No evidence of recurrent prostate carcinoma or metastatic disease.
No other significant abnormality identified.

## 2012-07-16 ENCOUNTER — Other Ambulatory Visit: Payer: Self-pay | Admitting: Urology

## 2012-07-30 ENCOUNTER — Encounter (HOSPITAL_BASED_OUTPATIENT_CLINIC_OR_DEPARTMENT_OTHER): Payer: Self-pay | Admitting: *Deleted

## 2012-07-30 NOTE — Progress Notes (Signed)
Pt instructed npo p mn 1/30 x verapamil w sip of water.  To [facility] 1/31 @ 1045.  Needs istat, ekg on arrival.

## 2012-08-03 ENCOUNTER — Encounter (HOSPITAL_BASED_OUTPATIENT_CLINIC_OR_DEPARTMENT_OTHER): Payer: Self-pay

## 2012-08-03 ENCOUNTER — Encounter (HOSPITAL_BASED_OUTPATIENT_CLINIC_OR_DEPARTMENT_OTHER): Payer: Self-pay | Admitting: Anesthesiology

## 2012-08-03 ENCOUNTER — Ambulatory Visit (HOSPITAL_BASED_OUTPATIENT_CLINIC_OR_DEPARTMENT_OTHER): Payer: Medicare Other | Admitting: Anesthesiology

## 2012-08-03 ENCOUNTER — Other Ambulatory Visit: Payer: Self-pay

## 2012-08-03 ENCOUNTER — Ambulatory Visit (HOSPITAL_BASED_OUTPATIENT_CLINIC_OR_DEPARTMENT_OTHER)
Admission: RE | Admit: 2012-08-03 | Discharge: 2012-08-03 | Disposition: A | Discharge status: Home / Self Care | Payer: Medicare Other | Source: Ambulatory Visit | Attending: Urology | Admitting: Urology

## 2012-08-03 ENCOUNTER — Encounter (HOSPITAL_BASED_OUTPATIENT_CLINIC_OR_DEPARTMENT_OTHER)
Admission: RE | Disposition: A | Discharge status: Home / Self Care | Payer: Self-pay | Source: Ambulatory Visit | Attending: Urology

## 2012-08-03 DIAGNOSIS — C61 Malignant neoplasm of prostate: Secondary | ICD-10-CM | POA: Insufficient documentation

## 2012-08-03 DIAGNOSIS — Z7982 Long term (current) use of aspirin: Secondary | ICD-10-CM | POA: Insufficient documentation

## 2012-08-03 DIAGNOSIS — I10 Essential (primary) hypertension: Secondary | ICD-10-CM | POA: Insufficient documentation

## 2012-08-03 DIAGNOSIS — I4891 Unspecified atrial fibrillation: Secondary | ICD-10-CM | POA: Insufficient documentation

## 2012-08-03 DIAGNOSIS — Z79899 Other long term (current) drug therapy: Secondary | ICD-10-CM | POA: Insufficient documentation

## 2012-08-03 DIAGNOSIS — Z9079 Acquired absence of other genital organ(s): Secondary | ICD-10-CM | POA: Insufficient documentation

## 2012-08-03 HISTORY — PX: ORCHIECTOMY: SHX2116

## 2012-08-03 HISTORY — DX: Cardiac arrhythmia, unspecified: I49.9

## 2012-08-03 LAB — POCT I-STAT 4, (NA,K, GLUC, HGB,HCT)
Potassium: 3.9 mEq/L (ref 3.5–5.1)
Sodium: 141 mEq/L (ref 135–145)

## 2012-08-03 SURGERY — ORCHIECTOMY
Anesthesia: General | Site: Scrotum | Laterality: Bilateral | Wound class: Clean Contaminated

## 2012-08-03 MED ORDER — BUPIVACAINE HCL (PF) 0.25 % IJ SOLN
INTRAMUSCULAR | Status: DC | PRN
  Administered 2012-08-03: 17 mL

## 2012-08-03 MED ORDER — CEFAZOLIN SODIUM-DEXTROSE 2-3 GM-% IV SOLR
2.0000 g | INTRAVENOUS | Status: AC
  Administered 2012-08-03: 2 g via INTRAVENOUS
  Filled 2012-08-03: qty 50

## 2012-08-03 MED ORDER — CEFAZOLIN SODIUM 1-5 GM-% IV SOLN
1.0000 g | INTRAVENOUS | Status: DC
  Filled 2012-08-03: qty 50

## 2012-08-03 MED ORDER — MIDAZOLAM HCL 5 MG/5ML IJ SOLN
INTRAMUSCULAR | Status: DC | PRN
  Administered 2012-08-03: 2 mg via INTRAVENOUS

## 2012-08-03 MED ORDER — HYDROCODONE-ACETAMINOPHEN 5-500 MG PO CAPS: 1.0000 | ORAL_CAPSULE | Freq: Four times a day (QID) | ORAL | Status: DC | PRN

## 2012-08-03 MED ORDER — PROPOFOL 10 MG/ML IV BOLUS
INTRAVENOUS | Status: DC | PRN
  Administered 2012-08-03: 50 mg via INTRAVENOUS

## 2012-08-03 MED ORDER — FENTANYL CITRATE 0.05 MG/ML IJ SOLN
INTRAMUSCULAR | Status: DC | PRN
  Administered 2012-08-03 (×2): 50 ug via INTRAVENOUS

## 2012-08-03 MED ORDER — LACTATED RINGERS IV SOLN
INTRAVENOUS | Status: DC
  Administered 2012-08-03: 12:00:00 via INTRAVENOUS
  Filled 2012-08-03: qty 1000

## 2012-08-03 MED ORDER — PROPOFOL INFUSION 10 MG/ML OPTIME
INTRAVENOUS | Status: DC | PRN
  Administered 2012-08-03: 25 ug/kg/min via INTRAVENOUS

## 2012-08-03 MED ORDER — LIDOCAINE HCL (CARDIAC) 20 MG/ML IV SOLN
INTRAVENOUS | Status: DC | PRN
  Administered 2012-08-03: 50 mg via INTRAVENOUS

## 2012-08-03 MED ORDER — GLYCOPYRROLATE 0.2 MG/ML IJ SOLN
INTRAMUSCULAR | Status: DC | PRN
  Administered 2012-08-03: 0.2 mg via INTRAVENOUS

## 2012-08-03 SURGICAL SUPPLY — 45 items
APPLICATOR COTTON TIP 6IN STRL (MISCELLANEOUS) ×2 IMPLANT
BANDAGE GAUZE ELAST BULKY 4 IN (GAUZE/BANDAGES/DRESSINGS) ×2 IMPLANT
BENZOIN TINCTURE PRP APPL 2/3 (GAUZE/BANDAGES/DRESSINGS) ×2 IMPLANT
BLADE SURG 15 STRL LF DISP TIS (BLADE) ×2 IMPLANT
BLADE SURG 15 STRL SS (BLADE) ×2
BLADE SURG ROTATE 9660 (MISCELLANEOUS) ×2 IMPLANT
CANISTER SUCTION 1200CC (MISCELLANEOUS) IMPLANT
CLEANER CAUTERY TIP 5X5 PAD (MISCELLANEOUS) IMPLANT
CLOTH BEACON ORANGE TIMEOUT ST (SAFETY) ×2 IMPLANT
COVER MAYO STAND STRL (DRAPES) ×2 IMPLANT
COVER TABLE BACK 60X90 (DRAPES) ×2 IMPLANT
DISSECTOR ROUND CHERRY 3/8 STR (MISCELLANEOUS) IMPLANT
DRAIN PENROSE 18X1/2 LTX STRL (DRAIN) IMPLANT
DRAIN PENROSE 18X1/4 LTX STRL (WOUND CARE) ×2 IMPLANT
DRAPE LAPAROTOMY TRNSV 102X78 (DRAPE) ×2 IMPLANT
DRSG TEGADERM 4X4.75 (GAUZE/BANDAGES/DRESSINGS) ×2 IMPLANT
ELECT NEEDLE TIP 2.8 STRL (NEEDLE) ×4 IMPLANT
ELECT REM PT RETURN 9FT ADLT (ELECTROSURGICAL) ×2
ELECTRODE REM PT RTRN 9FT ADLT (ELECTROSURGICAL) ×1 IMPLANT
GLOVE BIO SURGEON STRL SZ7 (GLOVE) ×2 IMPLANT
GOWN STRL NON-REIN LRG LVL3 (GOWN DISPOSABLE) ×4 IMPLANT
GOWN W/COTTON TOWEL STD LRG (GOWNS) ×4 IMPLANT
NEEDLE HYPO 25X1 1.5 SAFETY (NEEDLE) ×2 IMPLANT
NS IRRIG 500ML POUR BTL (IV SOLUTION) IMPLANT
PACK BASIN DAY SURGERY FS (CUSTOM PROCEDURE TRAY) ×2 IMPLANT
PAD CLEANER CAUTERY TIP 5X5 (MISCELLANEOUS)
PENCIL BUTTON HOLSTER BLD 10FT (ELECTRODE) ×2 IMPLANT
STRIP CLOSURE SKIN 1/4X4 (GAUZE/BANDAGES/DRESSINGS) ×2 IMPLANT
SUPPORT SCROTAL LG STRP (MISCELLANEOUS) ×2 IMPLANT
SUT MNCRL AB 4-0 PS2 18 (SUTURE) ×2 IMPLANT
SUT SILK 0 SH 30 (SUTURE) IMPLANT
SUT SILK 0 TIES 10X30 (SUTURE) IMPLANT
SUT SILK 2 0 SH (SUTURE) IMPLANT
SUT VIC AB 0 CT1 36 (SUTURE) ×2 IMPLANT
SUT VIC AB 0 SH 27 (SUTURE) ×2 IMPLANT
SUT VIC AB 3-0 SH 27 (SUTURE) ×3
SUT VIC AB 3-0 SH 27X BRD (SUTURE) ×3 IMPLANT
SUT VICRYL 0 TIES 12 18 (SUTURE) ×2 IMPLANT
SYR 20CC LL (SYRINGE) IMPLANT
SYR BULB IRRIGATION 50ML (SYRINGE) ×2 IMPLANT
SYR CONTROL 10ML LL (SYRINGE) ×2 IMPLANT
TOWEL OR 17X24 6PK STRL BLUE (TOWEL DISPOSABLE) ×4 IMPLANT
TUBE CONNECTING 12X1/4 (SUCTIONS) IMPLANT
WATER STERILE IRR 500ML POUR (IV SOLUTION) IMPLANT
YANKAUER SUCT BULB TIP NO VENT (SUCTIONS) IMPLANT

## 2012-08-03 NOTE — Op Note (Signed)
Gregory Bryan is a 72 y.o.   08/03/2012  Monitor Anesthesia Care  Pre-op diagnosis: Metastatic prostate cancer  Postop diagnosis: Same  Procedure done: Bilateral orchiectomy  Surgeon: Wendie Simmer. Haylynn Pha  Anesthesia: Monitored anesthesia care plus local Indication: Patient is a 72 years old male who had radical prostatectomy in June 2010 for high-grade prostate cancer. It was followed by salvage radiation.  He is now on androgen deprivation. He wants to stop taking Lupron and requests to have bilateral orchiectomy. He is scheduled today for the procedure. He is  Procedure: The patient was identified by his wrist band and proper timeout was taken.  The procedure was started under local anesthesia with 0.25% Marcaine. The scrotum was infiltrated with the Marcaine. Then a longitudinal incision was made on the left scrotum. The incision was carried down to the subcutaneous tissues and the tunica vaginalis which was then incised. At this point the patient started complaining of discomfort then he received monitored anesthesia care. The testicle was then brought out through the incision. The vas was identified and separated from the rest of the cord and ligated with #0 Vicryl and cut in between ligatures. The gubernaculum was clamped with a hemostat and incised and ligated with #0 Vicryl. The rest of the cord was then ligated with #0 Vicryl and the testicle was excised. The stump of the cord was then suture ligated with #0 Vicryl. There was no evidence of bleeding at the end of the procedure. The wound was then irrigated with normal saline. A quarter inch Penrose drain was placed in the scrotum and brought out through a separate stab wound. The incision was then closed in 2 layers with #3-0 Vicryl.  The same procedure was carried out on the right side.   EBL:  minimal   Needles, sponges count: Correct on 2 counts  The patient tolerated the procedure well and left the OR in satisfactory condition to  postanesthesia care unit  CC: Dr. Eli Hose

## 2012-08-03 NOTE — H&P (Signed)
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 His PSA at diagnosis was 4.1.  He then had salvage radiation.  He is currently on androgen deprivation and XGeva under Dr Alver Fisher care.  He would like to have bilateral orchiectomy insteasd of Lupron. He had a male sling for urinary incontinence.  He is still incontinent of urine and wears depends.  He does not want to take any antimuscarinics.  His PSA is now undetectable.He received his last Lupron about 3-4 weeks ago.   Past Medical History Problems  1. History of  Atrial Fibrillation 427.31 2. History of  Diverticulosis 562.10 3. History of  Hypertension 401.9 4. History of  Inguinal Hernia 550.90 5. History of  Nephrolithiasis V13.01 6. History of  Prostate Cancer V10.46 7. History of  Radiation Therapy V15.3 8. History of  Rhythm Disorder 427.9  Surgical History Problems  1. History of  Back Surgery 2. History of  Bladder Surgery 3. History of  Cholecystectomy 4. History of  Hernia Repair 5. History of  Oral Surgery 6. History of  Prostatectomy, Perineal Radical 7. History of  Surgery Of Male Genitalia Vasectomy V25.2  Current Meds 1. Aspirin 325 MG Oral Tablet; Therapy: (Recorded:03Nov2011) to 2. Hydrochlorothiazide 25 MG Oral Tablet; Therapy: (Recorded:03Nov2011) to 3. Verapamil HCl ER 240 MG Oral Capsule Extended Release 24 Hour; Therapy: 05Aug2013 to  Allergies Medication  1. Percocet TABS  Family History Problems  1. Family history of  Colon Cancer V16.0 2. Family history of  Death In The Family Father Deceased 42; old age 51. Family history of  Death In The Family Mother Deceased 83; stroke 4. Family history of  Family Health Status Number Of Children 5 sons & 3 daughters 5. Family history of  Hypertension V17.49 6. Paternal history of  Stroke Syndrome V17.1  Social History Problems  1. Alcohol Use 2. Caffeine Use occasional 3.  Marital History - Currently Married 4. 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 History of  Tobacco Use  Review of Systems Genitourinary, constitutional, skin, eye, otolaryngeal, hematologic/lymphatic, cardiovascular, pulmonary, endocrine, musculoskeletal, gastrointestinal, neurological and psychiatric system(s) were reviewed and pertinent findings if present are noted.    Vitals Vital Signs [Data Includes: Last 1 Day]  07Jan2014 01:26PM  BMI Calculated: 33.32 BSA Calculated: 2.33 Height: 6 ft  Weight: 246 lb  Blood Pressure: 115 / 80 Heart Rate: 88 Respiration: 18  Physical Exam Constitutional: Well nourished and well developed . No acute distress.  ENT:. The ears and nose are normal in appearance.  Neck: The appearance of the neck is normal and no neck mass is present.  Pulmonary: No respiratory distress and normal respiratory rhythm and effort.  Cardiovascular: Heart rate and rhythm are normal . No peripheral edema.  Abdomen: The abdomen is soft and nontender. No masses are palpated. No CVA tenderness. No hernias are palpable. No hepatosplenomegaly noted.  Genitourinary: Examination of the penis demonstrates no discharge, no masses, no lesions and a normal meatus. The penis is uncircumcised. The scrotum is normal in appearance and without lesions. The right epididymis is palpably normal and non-tender. The left epididymis is palpably normal and non-tender. The right testis is atrophic, but non-tender and without masses. The left testis is atrophic, but non-tender and without masses.  Lymphatics: The femoral and inguinal nodes are not enlarged or tender.  Skin: Normal skin turgor, no visible rash and no visible skin lesions.  Neuro/Psych:.  Mood and affect are appropriate.    Results/Data Urine [Data Includes: Last 1 Day]   07Jan2014  COLOR YELLOW   APPEARANCE CLOUDY   SPECIFIC GRAVITY 1.020   pH 7.5   GLUCOSE NEG mg/dL  BILIRUBIN NEG   KETONE NEG mg/dL  BLOOD  NEG   PROTEIN NEG mg/dL  UROBILINOGEN 1 mg/dL  NITRITE NEG   LEUKOCYTE ESTERASE NEG   SQUAMOUS EPITHELIAL/HPF NONE SEEN   WBC NONE SEEN WBC/hpf  RBC NONE SEEN RBC/hpf  BACTERIA NONE SEEN   CRYSTALS NONE SEEN   CASTS NONE SEEN   Other MODERATE AMORPHOUS MATERIAL    Assessment Assessed  1. Prostate Cancer 185  Plan Health Maintenance (V70.0)  1. UA With REFLEX  Done: 07Jan2014 01:14PM   Bilateral orchiectomy.  The procedure, risks, benefits wer explained to him and his wife.  The risks include but are not limited to hemorrhage, infection, hematoma.  They understand and wish to proceed.   Signatures  CC: Dr Eli Hose  Electronically signed by : Su Grand, M.D.; Jul 10 2012  2:27PM

## 2012-08-03 NOTE — Anesthesia Preprocedure Evaluation (Addendum)
Anesthesia Evaluation  Patient identified by MRN, date of birth, ID band Patient awake    Reviewed: Allergy & Precautions, H&P , NPO status , Patient's Chart, lab work & pertinent test results, reviewed documented beta blocker date and time   Airway Mallampati: II TM Distance: >3 FB Neck ROM: full    Dental No notable dental hx.    Pulmonary neg pulmonary ROS,  breath sounds clear to auscultation  Pulmonary exam normal       Cardiovascular Exercise Tolerance: Good hypertension, On Medications + dysrhythmias Atrial Fibrillation Rhythm:regular Rate:Normal     Neuro/Psych negative neurological ROS  negative psych ROS   GI/Hepatic negative GI ROS, Neg liver ROS,   Endo/Other  negative endocrine ROS  Renal/GU negative Renal ROS  negative genitourinary   Musculoskeletal   Abdominal (+) + obese,   Peds  Hematology negative hematology ROS (+)   Anesthesia Other Findings   Reproductive/Obstetrics negative OB ROS                           Anesthesia Physical Anesthesia Plan  ASA: III  Anesthesia Plan: MAC   Post-op Pain Management:    Induction:   Airway Management Planned:   Additional Equipment:   Intra-op Plan:   Post-operative Plan:   Informed Consent: I have reviewed the patients History and Physical, chart, labs and discussed the procedure including the risks, benefits and alternatives for the proposed anesthesia with the patient or authorized representative who has indicated his/her understanding and acceptance.   Dental Advisory Given  Plan Discussed with: CRNA  Anesthesia Plan Comments: ((Patient requests straight local with no sedation. I explained to him that the CRNA has to sit with him in the operating room in case he needs sedation/pain medicines.)   )      Anesthesia Quick Evaluation

## 2012-08-07 NOTE — Anesthesia Postprocedure Evaluation (Signed)
  Anesthesia Post-op Note  Patient: Gregory Bryan  Procedure(s) Performed: Procedure(s) (LRB): ORCHIECTOMY (Bilateral)  Patient Location: PACU  Anesthesia Type: MAC  Level of Consciousness: awake and alert   Airway and Oxygen Therapy: Patient Spontanous Breathing  Post-op Pain: mild  Post-op Assessment: Post-op Vital signs reviewed, Patient's Cardiovascular Status Stable, Respiratory Function Stable, Patent Airway and No signs of Nausea or vomiting  Last Vitals:  Filed Vitals:   08/03/12 1424  BP: 140/95  Pulse: 80  Temp: 36.1 C  Resp: 16    Post-op Vital Signs: stable   Complications: No apparent anesthesia complications

## 2012-08-07 NOTE — Transfer of Care (Signed)
Immediate Anesthesia Transfer of Care Note  Patient: Gregory Bryan [employer]  Procedure(s) Performed: Procedure(s) (LRB) with comments: ORCHIECTOMY (Bilateral)  Patient Location: PACU  Anesthesia Type:MAC  Level of Consciousness: awake  Airway & Oxygen Therapy: Patient Spontanous Breathing  Post-op Assessment: Report given to PACU RN  Post vital signs: Reviewed and stable  Complications: No apparent anesthesia complications. This note filed for [employer], CRNA

## 2012-08-14 ENCOUNTER — Encounter: Payer: Self-pay | Admitting: *Deleted

## 2012-08-14 NOTE — Progress Notes (Signed)
Patient calling to cancel future appt. Cancelled, patient will call us to re-schedule. Dr Clelia Croft aware.

## 2012-09-21 ENCOUNTER — Ambulatory Visit: Payer: Medicare Other | Admitting: Oncology

## 2013-02-21 ENCOUNTER — Telehealth: Payer: Self-pay | Admitting: Oncology

## 2013-02-21 NOTE — Telephone Encounter (Signed)
Returned pt's call re an appt w/FS. R/s appt from 3/21 and gv pt new d/t for 9/4 @ 1:15pm.

## 2013-03-07 ENCOUNTER — Ambulatory Visit (HOSPITAL_BASED_OUTPATIENT_CLINIC_OR_DEPARTMENT_OTHER): Payer: Medicare Other | Admitting: Oncology

## 2013-03-07 VITALS — BP 128/88 | HR 94 | Temp 97.0°F | Resp 18 | Ht 72.0 in | Wt 243.1 lb

## 2013-03-07 DIAGNOSIS — C61 Malignant neoplasm of prostate: Secondary | ICD-10-CM

## 2013-03-07 DIAGNOSIS — M899 Disorder of bone, unspecified: Secondary | ICD-10-CM

## 2013-03-07 DIAGNOSIS — C7951 Secondary malignant neoplasm of bone: Secondary | ICD-10-CM

## 2013-03-07 NOTE — Progress Notes (Signed)
Hematology and Oncology Follow Up Visit  Gregory Bryan [id number] 1941/02/01 72 y.o. 03/07/2013 1:29 PM  CC: [name], M.D.  [name], M.D.  [name], M.D.    Principle Diagnosis: This is a 72 year old gentleman with hormone-sensitive prostate cancer.  He has metastatic disease to the bone.  Initial diagnosis of prostate cancer dating back to 2010, Gleason score 5 + 4 = 9, PSA 4.1.    Prior Therapy: 1. Status post prostatectomy, at that time was T3a N0 M0 disease, done December 24, 2008.  PSA nadir down to 0.25. 2. The patient received adjuvant radiation therapy to the prostatic bed due to a rise in his PSA and positive margins after his prostatectomy.  He received a total of 33 fractions for a total of 6600 cGy at that time.  Therapy between May 25, 2009, until July 15, 2009. 3. The patient had a rise in his PSA and subsequently his PSA on December 09, 2010, was as high as 10.0, and a bone scan showed bony metastasis.  Current therapy: 1. He is on Lupron 22.5 mg subcutaneous injection started on 12/09/2010. This was stopped on 06/2012. He is S/P Orchiectomy in 07/2012.  2. He is on vitamin D and calcium supplement. 3. Xgeva every 4-6 weeks. This has been on hold due to dental surgery.   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  He is a gentleman with hormone-sensitive prostate cancer with metastatic disease to the bone.  He had his first Lupron on 12/09/2010.  He had an excellent PSA response, with PSA dropping down to 0.32 on 01/20/2011 and now it is less than  0.01.  He is S/P Orchiectomy in 07/2012 and continues to report some hot flashes again which occur daily and are manageable.  He had not reported any major changes in his activity level.  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 His energy has been reasonable.  He has not reported any back pain.  He did not report any shoulder pain.  Did not report any  pathological fractures.  Performance status and activity level remain reasonable. He reports frequent urinations espieclly at night time. No bone pain noted at this time. He Had dental work done recently and healing properly.    Medications: I have reviewed the patient's current medications. Current outpatient prescriptions:aspirin 325 MG tablet, Take 325 mg by mouth daily.  , Disp: , Rfl: ;  calcium carbonate (OS-CAL) 600 MG TABS, Take 600 mg by mouth daily.  , Disp: , Rfl: ;  hydrochlorothiazide (HYDRODIURIL) 25 MG tablet, Take 25 mg by mouth daily.  , Disp: , Rfl: ;  verapamil (CALAN) 120 MG tablet, Take 120 mg by mouth daily., Disp: , Rfl: ;  denosumab (XGEVA) 120 MG/1.7ML SOLN, Inject 120 mg into the skin once., Disp: , Rfl:  hydrocodone-acetaminophen (LORCET-HD) 5-500 MG per capsule, Take 1 capsule by mouth every 6 (six) hours as needed for pain., Disp: 30 capsule, Rfl: 0;  [DISCONTINUED] diltiazem (TIAZAC) 300 MG 24 hr capsule, Take 300 mg by mouth daily.  , Disp: , Rfl:   Allergies:  Allergies  Allergen Reactions  . Oxycodone Nausea And Vomiting  . Percocet [Oxycodone-Acetaminophen]     Past Medical History, Surgical history, Social history, and Family History were reviewed and updated.  Review of Systems:  Remaining ROS negative. Physical Exam: Blood pressure 128/88, pulse 94, temperature 97 F (36.1 C), temperature source Oral, resp. rate 18,  height 6' (1.829 m), weight 243 lb 1.6 oz (110.269 kg), SpO2 100.00%. ECOG: 0 General appearance: alert Head: Normocephalic, without obvious abnormality, atraumatic Neck: no adenopathy, no carotid bruit, no JVD, supple, symmetrical, trachea midline and thyroid not enlarged, symmetric, no tenderness/mass/nodules Lymph nodes: Cervical, supraclavicular, and axillary nodes normal. Heart:regular rate and rhythm, S1, S2 normal, no murmur, click, rub or gallop Lung:chest clear, no wheezing, rales, normal symmetric air entry Abdomin: soft,  non-tender, without masses or organomegaly EXT:no erythema, induration, or nodules   Lab Results reviewed from 02/2013  PSA of 0.03     Impression and Plan: A 72 year old gentleman with the following issues: 1. Hormone-sensitive prostate cancer.  He has metastatic disease to the bone.  His PSA dropped down from 10 down to 0.03 now.  He has continued to be asymptomatic at this point.  He has continued to be hormone sensitive. No intervention is needed at this point. If his PSA starts to rise again we will add Casodex for combined androgen deprivation. 2. Bony disease.  We will hold Xgeva due to dental work.       [facility], MD 9/4/20141:29 PM

## 2013-10-29 ENCOUNTER — Encounter (HOSPITAL_BASED_OUTPATIENT_CLINIC_OR_DEPARTMENT_OTHER): Payer: Self-pay | Admitting: Urology

## 2014-04-07 ENCOUNTER — Emergency Department (HOSPITAL_COMMUNITY)
Admission: EM | Admit: 2014-04-07 | Discharge: 2014-04-07 | Disposition: A | Discharge status: Home / Self Care | Payer: Medicare Other | Attending: Emergency Medicine | Admitting: Emergency Medicine

## 2014-04-07 ENCOUNTER — Encounter (HOSPITAL_COMMUNITY): Payer: Self-pay | Admitting: Emergency Medicine

## 2014-04-07 DIAGNOSIS — C61 Malignant neoplasm of prostate: Secondary | ICD-10-CM

## 2014-04-07 DIAGNOSIS — I10 Essential (primary) hypertension: Secondary | ICD-10-CM | POA: Diagnosis not present

## 2014-04-07 DIAGNOSIS — Z7982 Long term (current) use of aspirin: Secondary | ICD-10-CM | POA: Insufficient documentation

## 2014-04-07 DIAGNOSIS — L03116 Cellulitis of left lower limb: Secondary | ICD-10-CM | POA: Diagnosis not present

## 2014-04-07 DIAGNOSIS — Z79899 Other long term (current) drug therapy: Secondary | ICD-10-CM | POA: Insufficient documentation

## 2014-04-07 DIAGNOSIS — B9562 Methicillin resistant Staphylococcus aureus infection as the cause of diseases classified elsewhere: Secondary | ICD-10-CM

## 2014-04-07 DIAGNOSIS — R609 Edema, unspecified: Secondary | ICD-10-CM

## 2014-04-07 DIAGNOSIS — L0390 Cellulitis, unspecified: Secondary | ICD-10-CM

## 2014-04-07 DIAGNOSIS — Z8546 Personal history of malignant neoplasm of prostate: Secondary | ICD-10-CM | POA: Diagnosis not present

## 2014-04-07 DIAGNOSIS — A4902 Methicillin resistant Staphylococcus aureus infection, unspecified site: Secondary | ICD-10-CM | POA: Diagnosis not present

## 2014-04-07 HISTORY — DX: Paroxysmal atrial fibrillation: I48.0

## 2014-04-07 MED ORDER — CEFAZOLIN SODIUM 1-5 GM-% IV SOLN
1.0000 g | Freq: Once | INTRAVENOUS | Status: AC
  Administered 2014-04-07: 1 g via INTRAVENOUS
  Filled 2014-04-07: qty 50

## 2014-04-07 MED ORDER — SULFAMETHOXAZOLE-TRIMETHOPRIM 800-160 MG PO TABS: 1.0000 | ORAL_TABLET | Freq: Two times a day (BID) | ORAL | Status: AC

## 2014-04-07 MED ORDER — IBUPROFEN 400 MG PO TABS
400.0000 mg | ORAL_TABLET | Freq: Once | ORAL | Status: DC
  Filled 2014-04-07: qty 1

## 2014-04-07 MED ORDER — SULFAMETHOXAZOLE-TMP DS 800-160 MG PO TABS
1.0000 | ORAL_TABLET | Freq: Once | ORAL | Status: AC
  Administered 2014-04-07: 1 via ORAL
  Filled 2014-04-07: qty 1

## 2014-04-07 MED ORDER — CEPHALEXIN 500 MG PO CAPS: 500.0000 mg | ORAL_CAPSULE | Freq: Four times a day (QID) | ORAL | Status: AC

## 2014-04-07 NOTE — ED Provider Notes (Signed)
CSN: [id number]     Arrival date & time 04/07/14  9323 History   First MD Initiated Contact with Patient 04/07/14 [phone]     Chief Complaint  Patient presents with  . Leg Swelling    L leg swelling and redness     (Consider location/radiation/quality/duration/timing/severity/associated sxs/prior Treatment) The history is provided by the patient.  pt with hx prostate ca, c/o left lower leg swelling and redness in the past few days.  States first noticed a few small red spots, and wonder whether possibly bug bite.  In past day, redness and increased warmth of left lower leg anteriorly, with left lower leg swelling. No calf pain. No chest pain or sob. No hx dvt. No fever or chills. No nv. States does not feel ill of sick. Symptom persistent, constant, without specific exacerbating or alleviating factors.      Past Medical History  Diagnosis Date  . Prostate ca     prostate ca dx 5/10  . Hypertension   . Prostate cancer 06/22/2011  . Dysrhythmia     a-fib  . Paroxysmal a-fib    Past Surgical History  Procedure Laterality Date  . Cholecystectomy    . Hernia repair    . Back surgery    . Prostatectomy    . Orchiectomy Bilateral 08/03/2012    Procedure: ORCHIECTOMY;  Surgeon: Lowella Bandy, MD;  Location: [facility];  Service: Urology;  Laterality: Bilateral;   No family history on file. History  Substance Use Topics  . Smoking status: Never Smoker   . Smokeless tobacco: Not on file  . Alcohol Use: No    Review of Systems  Constitutional: Negative for fever and chills.  HENT: Negative for sore throat.   Eyes: Negative for redness.  Respiratory: Negative for shortness of breath.   Cardiovascular: Negative for chest pain.  Gastrointestinal: Negative for vomiting, abdominal pain and diarrhea.  Genitourinary: Negative for flank pain.  Musculoskeletal: Negative for back pain and neck pain.  Skin: Negative for wound.  Neurological: Negative for headaches.   Hematological: Does not bruise/bleed easily.  Psychiatric/Behavioral: Negative for confusion.      Allergies  Oxycodone and Percocet  Home Medications   Prior to Admission medications   Medication Sig Start Date End Date Taking? Authorizing Provider  aspirin 325 MG tablet Take 325 mg by mouth daily.      Historical Provider, MD  calcium carbonate (OS-CAL) 600 MG TABS Take 600 mg by mouth daily.      Historical Provider, MD  denosumab (XGEVA) 120 MG/1.7ML SOLN Inject 120 mg into the skin once.    Historical Provider, MD  hydrochlorothiazide (HYDRODIURIL) 25 MG tablet Take 25 mg by mouth daily.      Historical Provider, MD  hydrocodone-acetaminophen (LORCET-HD) 5-500 MG per capsule Take 1 capsule by mouth every 6 (six) hours as needed for pain. 08/03/12   Arvil Persons, MD  verapamil (CALAN) 120 MG tablet Take 120 mg by mouth daily.    Historical Provider, MD   BP 170/101  Pulse 92  Temp(Src) 97.7 F (36.5 C) (Oral)  Resp 16  SpO2 97% Physical Exam  Nursing note and vitals reviewed. Constitutional: He is oriented to person, place, and time. He appears well-developed and well-nourished. No distress.  HENT:  Mouth/Throat: Oropharynx is clear and moist.  Eyes: Conjunctivae are normal. No scleral icterus.  Neck: Neck supple. No tracheal deviation present.  Cardiovascular: Normal rate, regular rhythm, normal heart sounds and intact distal pulses.  Pulmonary/Chest: Effort normal and breath sounds normal. No accessory muscle usage. No respiratory distress.  Abdominal: Soft. Bowel sounds are normal. He exhibits no distension. There is no tenderness.  Musculoskeletal: Normal range of motion.  Left lower leg swelling. Distal pulses palp. Erythema and increased warmth to skin of left lower leg anteriorly c/w non purulent cellulitis, however also w few small circular erythematous lesions c/w mrsa, no areas of fluctance or abscess requiring I and D. No crepitus.   Neurological: He is alert and  oriented to person, place, and time.  Skin: Skin is warm and dry. He is not diaphoretic.  Psychiatric: He has a normal mood and affect.    ED Course  Procedures (including critical care time) Labs Review   Gregory Bryan, Gregory Bryan Gregory Bryan 21-Feb-1941 [id number]            Progress Notes by Charlaine Dalton, RVT at 04/07/2014 9:30 AM    Author: Charlaine Dalton, RVT Service: Vascular Lab Author Type: Cardiovascular Sonographer   Filed: 04/07/2014 9:30 AM Note Time: 04/07/2014 9:30 AM Status: Signed   Editor: Charlaine Dalton, RVT (Cardiovascular Sonographer)      Left lower extremity venous duplex completed. Left: No evidence of DVT, superficial thrombosis, or Baker's cyst. Right: Negative for DVT in the common femoral vein.     MDM  Vascular dopplers ordered.  Confirmed no antibiotic allergies w pt.   Ancef 1 gm iv.  Reviewed nursing notes and prior charts for additional history.   Vascular doppler results above neg for dvt.  As pt has few small erythematous lesions in and about the confluent area of non purulent cellulitis, concern for possible mrsa related cellultis - will double cover w bactrim. Bactrim ds po.   Recheck pt comfortable, afeb, appear stable for d/c.     Mirna Mires, MD 04/07/14 [phone]

## 2014-04-07 NOTE — ED Notes (Signed)
Pt states Wed he noticed what looked like bug bites to L medial ankle.  By Friday entire L lower leg below L knee was red and swollen.

## 2014-04-07 NOTE — Progress Notes (Signed)
Left lower extremity venous duplex completed.  Left:  No evidence of DVT, superficial thrombosis, or Baker's cyst.  Right:  Negative for DVT in the common femoral vein.  

## 2014-04-07 NOTE — Discharge Instructions (Signed)
It was our pleasure to provide your ER care today - we hope that you feel better.  Elevate leg. Keep skin clean/dry. Take tylenol/advil as need.  Take antibiotics (keflex and bactrim) as prescribed. Follow up with primary care doctor in the next few days if symptoms fail to improve/resolve.  Return to ER if worse, new symptoms, high fevers, weak/faint, spreading redness, severe pain, other concern.    Cellulitis Cellulitis is an infection of the skin and the tissue beneath it. The infected area is usually red and tender. Cellulitis occurs most often in the arms and lower legs.  CAUSES  Cellulitis is caused by bacteria that enter the skin through cracks or cuts in the skin. The most common types of bacteria that cause cellulitis are staphylococci and streptococci. SIGNS AND SYMPTOMS   Redness and warmth.  Swelling.  Tenderness or pain.  Fever. DIAGNOSIS  Your health care provider can usually determine what is wrong based on a physical exam. Blood tests may also be done. TREATMENT  Treatment usually involves taking an antibiotic medicine. HOME CARE INSTRUCTIONS   Take your antibiotic medicine as directed by your health care provider. Finish the antibiotic even if you start to feel better.  Keep the infected arm or leg elevated to reduce swelling.  Apply a warm cloth to the affected area up to 4 times per day to relieve pain.  Take medicines only as directed by your health care provider.  Keep all follow-up visits as directed by your health care provider. SEEK MEDICAL CARE IF:   You notice red streaks coming from the infected area.  Your red area gets larger or turns dark in color.  Your bone or joint underneath the infected area becomes painful after the skin has healed.  Your infection returns in the same area or another area.  You notice a swollen bump in the infected area.  You develop new symptoms.  You have a fever. SEEK IMMEDIATE MEDICAL CARE IF:   You feel  very sleepy.  You develop vomiting or diarrhea.  You have a general ill feeling (malaise) with muscle aches and pains. MAKE SURE YOU:   Understand these instructions.  Will watch your condition.  Will get help right away if you are not doing well or get worse. Document Released: 03/30/2005 Document Revised: 11/04/2013 Document Reviewed: 09/05/2011 [facility] Patient Information 2015 [place], [place]. This information is not intended to replace advice given to you by your health care provider. Make sure you discuss any questions you have with your health care provider.

## 2014-06-19 ENCOUNTER — Other Ambulatory Visit: Payer: Self-pay | Admitting: Nurse Practitioner

## 2018-02-01 DEATH — deceased
